# Patient Record
Sex: Female | Born: 1950 | Race: Black or African American | Hispanic: No | Marital: Single | Smoking: Never smoker
Health system: Southern US, Community
[De-identification: ages and names within clinical notes are randomized; demographics above are authoritative.]

## PROBLEM LIST (undated history)

## (undated) DIAGNOSIS — I10 Essential (primary) hypertension: Secondary | ICD-10-CM

## (undated) DIAGNOSIS — M1990 Unspecified osteoarthritis, unspecified site: Secondary | ICD-10-CM

## (undated) DIAGNOSIS — K449 Diaphragmatic hernia without obstruction or gangrene: Secondary | ICD-10-CM

## (undated) DIAGNOSIS — R0789 Other chest pain: Secondary | ICD-10-CM

## (undated) DIAGNOSIS — M109 Gout, unspecified: Secondary | ICD-10-CM

## (undated) HISTORY — PX: APPENDECTOMY: SHX54

## (undated) HISTORY — DX: Essential (primary) hypertension: I10

## (undated) HISTORY — PX: REPLACEMENT TOTAL KNEE: SUR1224

## (undated) HISTORY — DX: Other chest pain: R07.89

## (undated) HISTORY — PX: CARDIAC SURGERY: SHX584

## (undated) HISTORY — DX: Gout, unspecified: M10.9

---

## 2005-07-11 ENCOUNTER — Emergency Department: Payer: Self-pay | Admitting: Emergency Medicine

## 2005-10-19 ENCOUNTER — Other Ambulatory Visit: Payer: Self-pay

## 2005-10-19 ENCOUNTER — Emergency Department: Payer: Self-pay | Admitting: Emergency Medicine

## 2006-03-01 ENCOUNTER — Emergency Department: Payer: Self-pay | Admitting: Emergency Medicine

## 2006-04-21 ENCOUNTER — Emergency Department: Payer: Self-pay | Admitting: Emergency Medicine

## 2007-04-04 ENCOUNTER — Emergency Department: Payer: Self-pay | Admitting: Emergency Medicine

## 2007-04-23 ENCOUNTER — Ambulatory Visit: Payer: Self-pay

## 2007-06-26 ENCOUNTER — Emergency Department: Payer: Self-pay | Admitting: Emergency Medicine

## 2007-11-17 ENCOUNTER — Emergency Department: Payer: Self-pay | Admitting: Emergency Medicine

## 2007-11-17 ENCOUNTER — Other Ambulatory Visit: Payer: Self-pay

## 2008-01-09 ENCOUNTER — Emergency Department: Payer: Self-pay | Admitting: Emergency Medicine

## 2008-05-03 ENCOUNTER — Emergency Department: Payer: Self-pay | Admitting: Emergency Medicine

## 2008-07-29 ENCOUNTER — Emergency Department: Payer: Self-pay | Admitting: Emergency Medicine

## 2008-08-23 ENCOUNTER — Emergency Department: Payer: Self-pay | Admitting: Emergency Medicine

## 2008-09-28 ENCOUNTER — Emergency Department: Payer: Self-pay | Admitting: Internal Medicine

## 2008-10-15 ENCOUNTER — Emergency Department: Payer: Self-pay | Admitting: Emergency Medicine

## 2008-11-10 ENCOUNTER — Emergency Department: Payer: Self-pay | Admitting: Emergency Medicine

## 2009-04-07 ENCOUNTER — Ambulatory Visit: Payer: Self-pay | Admitting: Internal Medicine

## 2009-07-08 IMAGING — CR DG SHOULDER 3+V*R*
1 series · 3 of 3 positions shown · non-contrast
Comparison: No comparison

REASON FOR EXAM: Decrease ROM/increase pain 2nd to fall laxt night
COMMENTS:

PROCEDURE:     DXR - DXR SHOULDER RIGHT COMPLETE  - September 28, 2008  [DATE]
RESULT:     History: Decreased range of motion

[Series 1: view not recorded · 0.17mm/px · 3 of 3 slices shown]
[im 1/3]
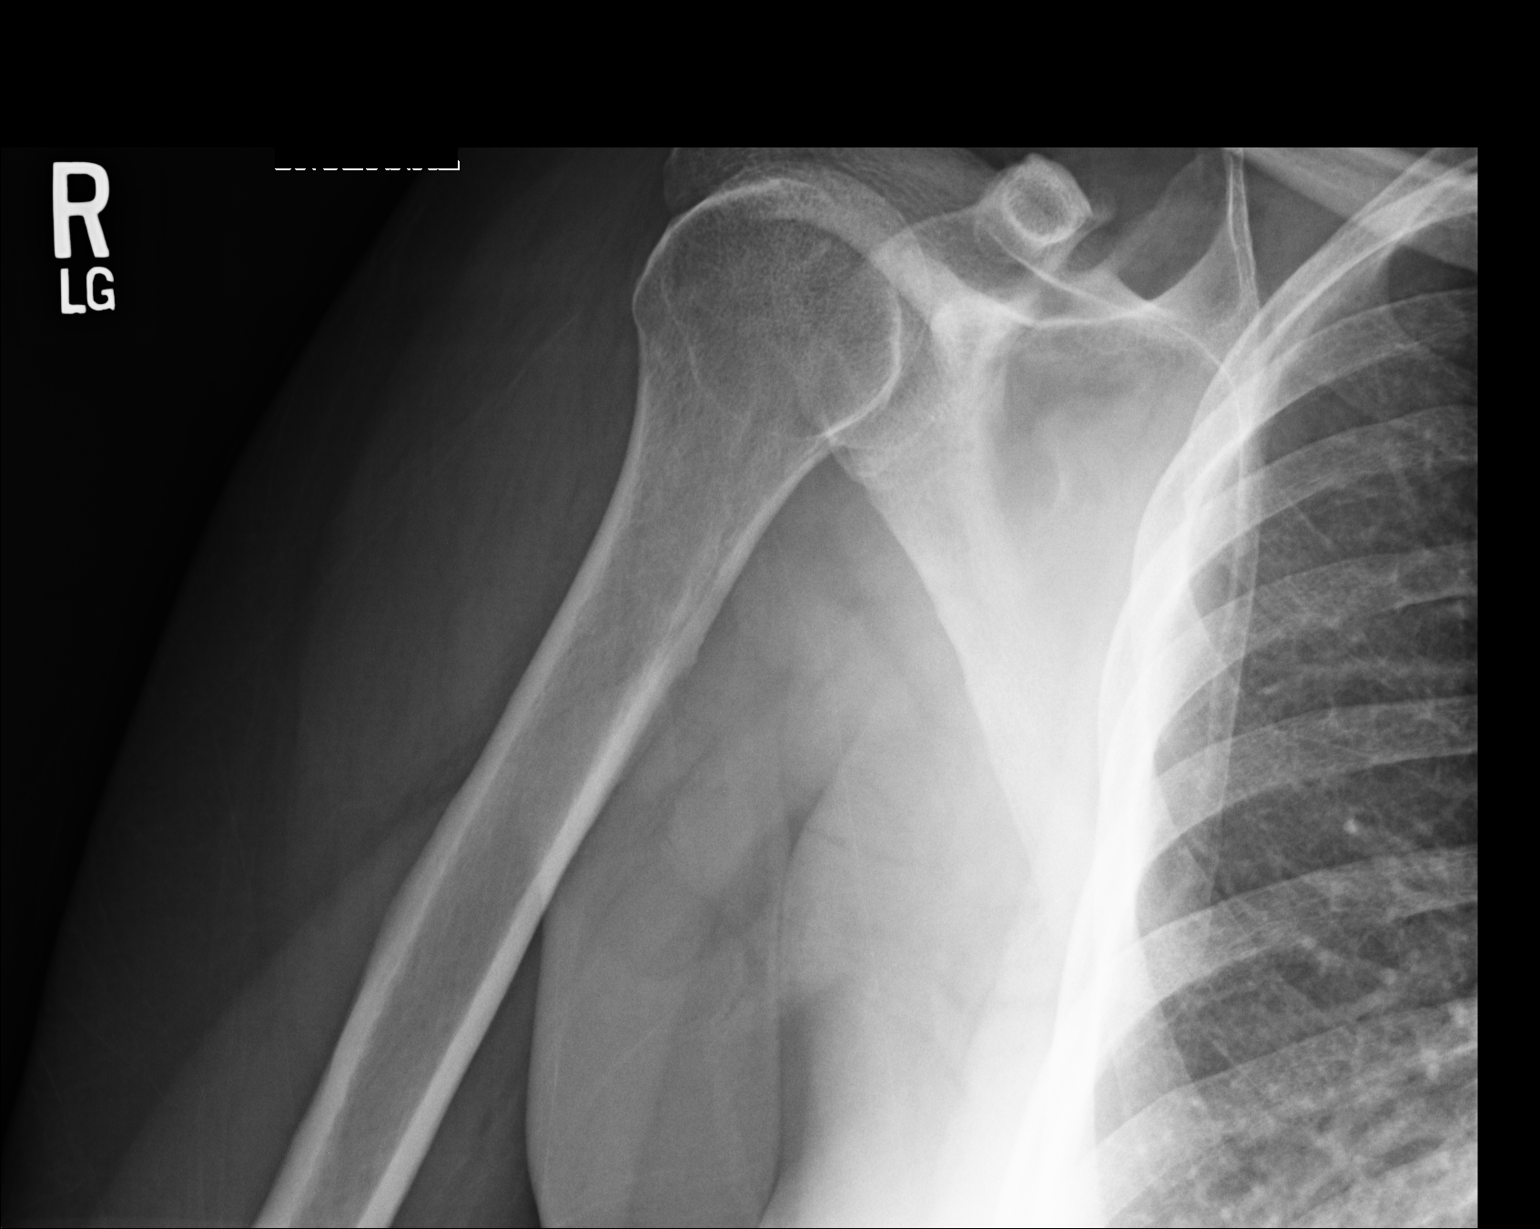
[im 2/3]
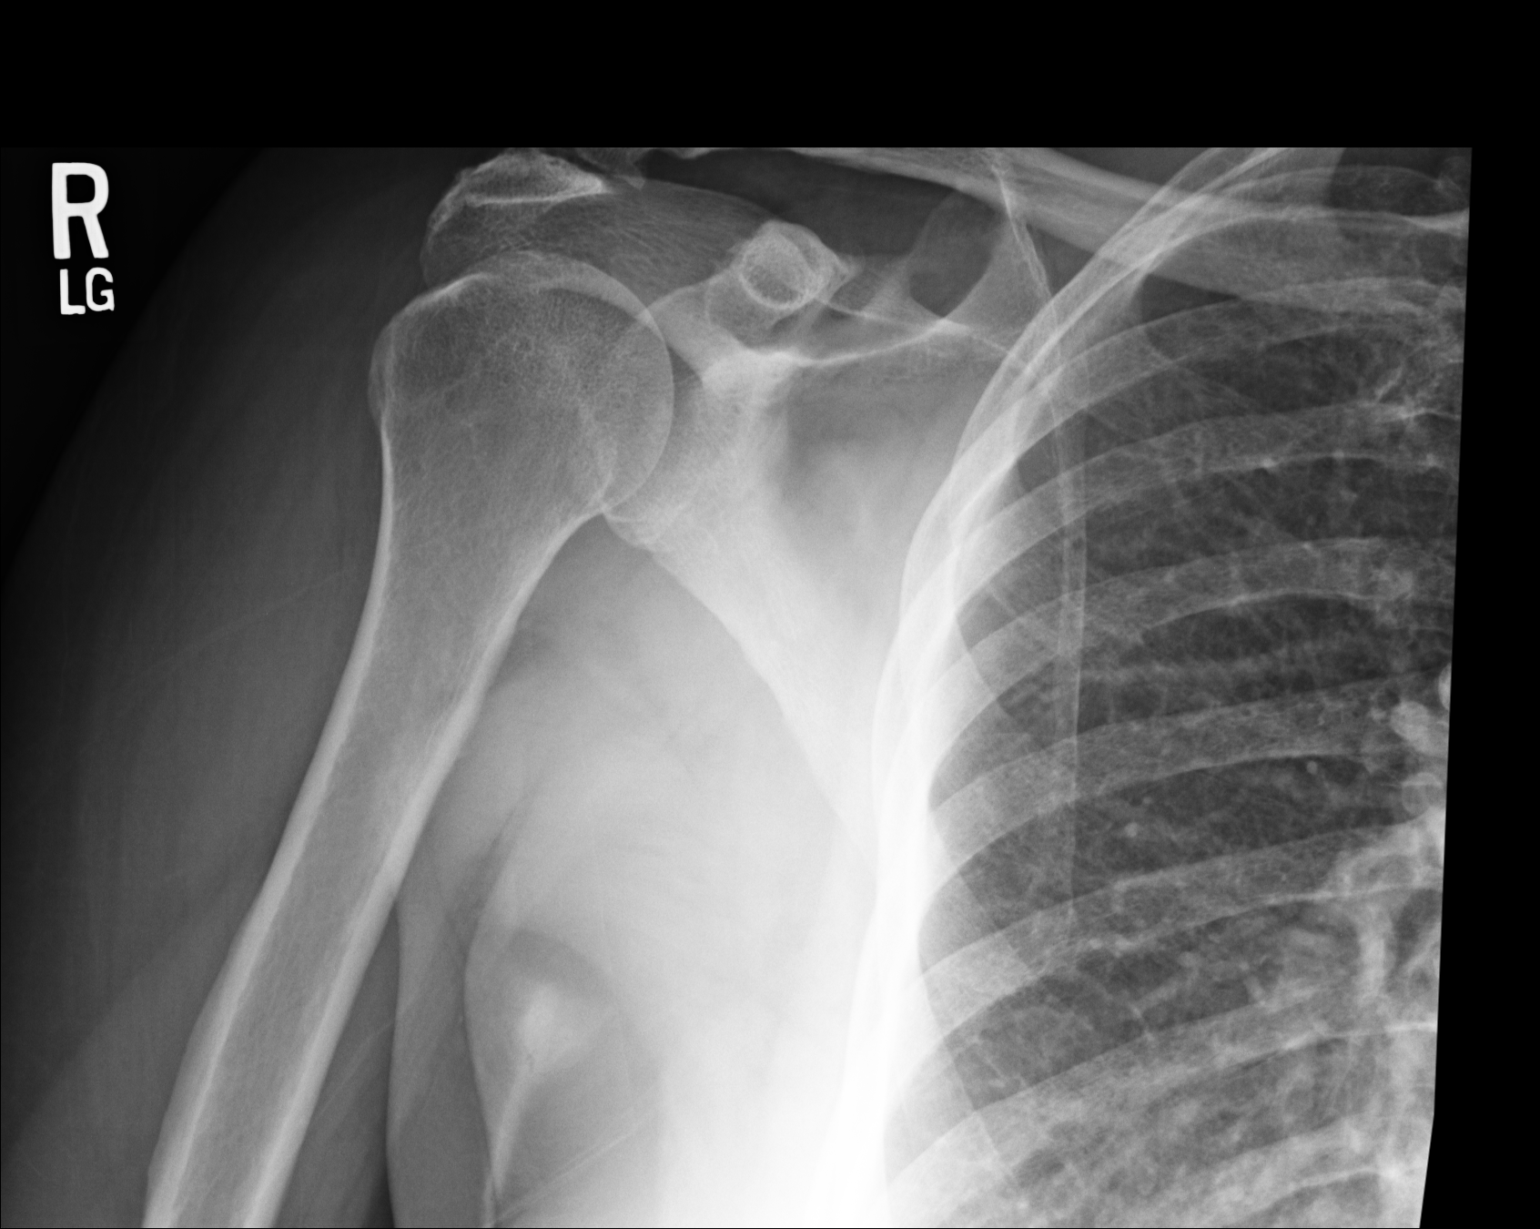
[im 3/3]
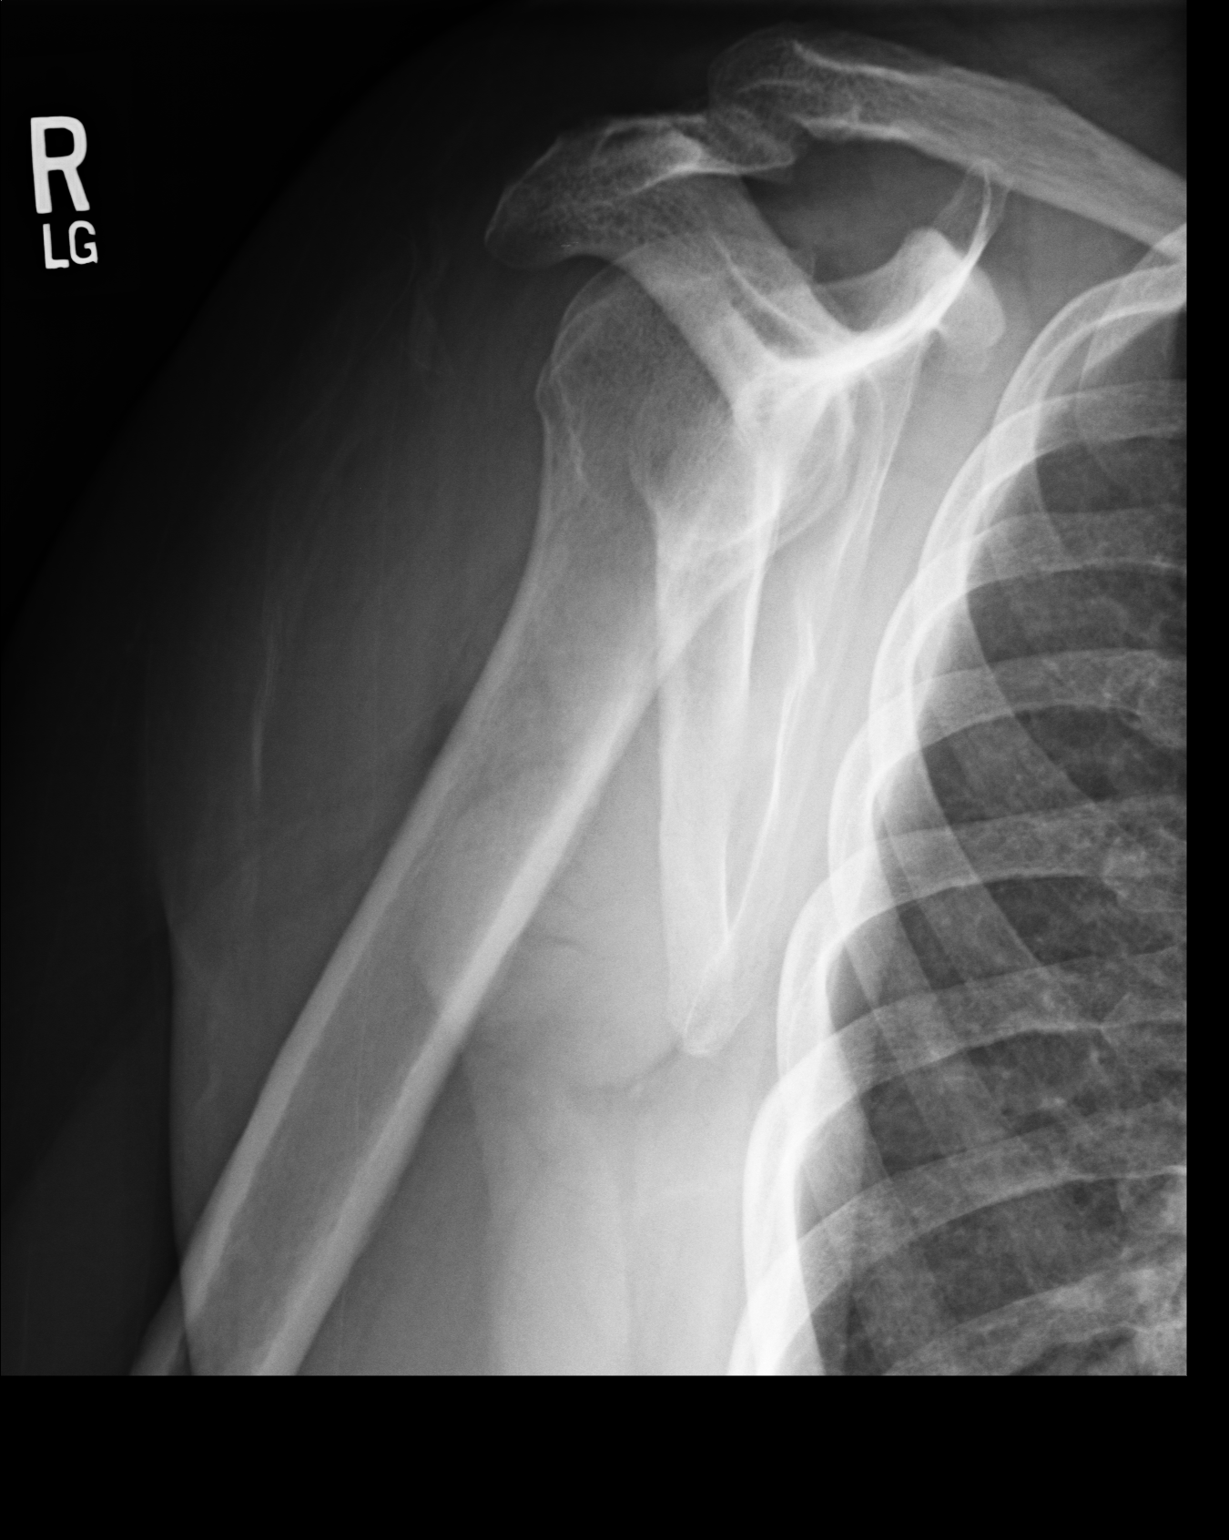

[3 of 3 positions shown; findings below may reference images not displayed]

FINDINGS: 3 views of the right shoulder demonstrates no fracture or dislocation. There
are mild degenerative changes of the right acromioclavicular joint.
IMPRESSION: No acute osseous injury of the right shoulder.

## 2009-08-16 ENCOUNTER — Emergency Department: Payer: Self-pay | Admitting: Emergency Medicine

## 2009-08-31 DIAGNOSIS — K2100 Gastro-esophageal reflux disease with esophagitis, without bleeding: Secondary | ICD-10-CM | POA: Insufficient documentation

## 2009-10-23 ENCOUNTER — Emergency Department: Payer: Self-pay | Admitting: Emergency Medicine

## 2009-11-21 ENCOUNTER — Emergency Department: Payer: Self-pay | Admitting: Internal Medicine

## 2010-01-14 ENCOUNTER — Emergency Department: Payer: Self-pay | Admitting: Emergency Medicine

## 2010-05-12 ENCOUNTER — Emergency Department: Payer: Self-pay | Admitting: Unknown Physician Specialty

## 2010-12-14 ENCOUNTER — Emergency Department: Payer: Self-pay | Admitting: Emergency Medicine

## 2011-02-07 ENCOUNTER — Emergency Department: Payer: Self-pay | Admitting: Emergency Medicine

## 2011-05-03 ENCOUNTER — Ambulatory Visit: Payer: Self-pay

## 2012-01-09 ENCOUNTER — Emergency Department: Payer: Self-pay | Admitting: Emergency Medicine

## 2012-01-09 LAB — URINALYSIS, COMPLETE
Bacteria: NONE SEEN
Bilirubin,UR: NEGATIVE
Glucose,UR: NEGATIVE mg/dL (ref 0–75)
Nitrite: NEGATIVE
Ph: 6 (ref 4.5–8.0)
RBC,UR: 4 /HPF (ref 0–5)
Specific Gravity: 1.02 (ref 1.003–1.030)
Squamous Epithelial: 4

## 2012-01-09 LAB — COMPREHENSIVE METABOLIC PANEL
Anion Gap: 7 (ref 7–16)
BUN: 21 mg/dL — ABNORMAL HIGH (ref 7–18)
Calcium, Total: 8.9 mg/dL (ref 8.5–10.1)
Chloride: 108 mmol/L — ABNORMAL HIGH (ref 98–107)
Creatinine: 0.99 mg/dL (ref 0.60–1.30)
EGFR (African American): >60
EGFR (Non-African Amer.): >60
Glucose: 124 mg/dL — ABNORMAL HIGH (ref 65–99)
Potassium: 3.7 mmol/L (ref 3.5–5.1)
SGOT(AST): 28 U/L (ref 15–37)
Total Protein: 7.7 g/dL (ref 6.4–8.2)

## 2012-01-09 LAB — CBC
MCH: 32 pg (ref 26.0–34.0)
MCV: 94 fL (ref 80–100)
Platelet: 202 10*3/uL (ref 150–440)
RBC: 3.51 10*6/uL — ABNORMAL LOW (ref 3.80–5.20)
RDW: 14.1 % (ref 11.5–14.5)

## 2012-01-09 LAB — LIPASE, BLOOD: Lipase: 291 U/L (ref 73–393)

## 2012-04-16 DIAGNOSIS — M179 Osteoarthritis of knee, unspecified: Secondary | ICD-10-CM | POA: Insufficient documentation

## 2012-12-20 ENCOUNTER — Emergency Department: Payer: Self-pay | Admitting: Emergency Medicine

## 2013-09-05 ENCOUNTER — Emergency Department: Payer: Self-pay | Admitting: Emergency Medicine

## 2013-09-05 LAB — CBC WITH DIFFERENTIAL/PLATELET
BASOS PCT: 0.4 %
Basophil #: 0 10*3/uL (ref 0.0–0.1)
EOS ABS: 0.2 10*3/uL (ref 0.0–0.7)
EOS PCT: 2.8 %
HCT: 37.5 % (ref 35.0–47.0)
HGB: 12.3 g/dL (ref 12.0–16.0)
LYMPHS ABS: 2.3 10*3/uL (ref 1.0–3.6)
LYMPHS PCT: 33.7 %
MCH: 30.4 pg (ref 26.0–34.0)
MCHC: 32.9 g/dL (ref 32.0–36.0)
MCV: 93 fL (ref 80–100)
Monocyte #: 0.7 x10 3/mm (ref 0.2–0.9)
Monocyte %: 9.8 %
NEUTROS ABS: 3.7 10*3/uL (ref 1.4–6.5)
Neutrophil %: 53.3 %
Platelet: 230 10*3/uL (ref 150–440)
RBC: 4.05 10*6/uL (ref 3.80–5.20)
RDW: 14.3 % (ref 11.5–14.5)
WBC: 6.9 10*3/uL (ref 3.6–11.0)

## 2013-09-05 LAB — COMPREHENSIVE METABOLIC PANEL
ALK PHOS: 116 U/L
AST: 25 U/L (ref 15–37)
Albumin: 3.5 g/dL (ref 3.4–5.0)
Anion Gap: 4 — ABNORMAL LOW (ref 7–16)
BUN: 14 mg/dL (ref 7–18)
Bilirubin,Total: 0.3 mg/dL (ref 0.2–1.0)
CO2: 28 mmol/L (ref 21–32)
CREATININE: 0.93 mg/dL (ref 0.60–1.30)
Calcium, Total: 8.7 mg/dL (ref 8.5–10.1)
Chloride: 109 mmol/L — ABNORMAL HIGH (ref 98–107)
EGFR (African American): >60
EGFR (Non-African Amer.): >60
Glucose: 99 mg/dL (ref 65–99)
Osmolality: 282 (ref 275–301)
Potassium: 3.6 mmol/L (ref 3.5–5.1)
SGPT (ALT): 20 U/L (ref 12–78)
Sodium: 141 mmol/L (ref 136–145)
Total Protein: 8.6 g/dL — ABNORMAL HIGH (ref 6.4–8.2)

## 2013-09-05 LAB — URINALYSIS, COMPLETE
BILIRUBIN, UR: NEGATIVE
Blood: NEGATIVE
Glucose,UR: NEGATIVE mg/dL (ref 0–75)
Ketone: NEGATIVE
Nitrite: NEGATIVE
PROTEIN: NEGATIVE
Ph: 7 (ref 4.5–8.0)
RBC,UR: 1 /HPF (ref 0–5)
SPECIFIC GRAVITY: 1.011 (ref 1.003–1.030)

## 2013-09-05 LAB — TROPONIN I: Troponin-I: 0.03 ng/mL

## 2013-09-05 LAB — LIPASE, BLOOD: LIPASE: 215 U/L (ref 73–393)

## 2013-09-07 LAB — URINE CULTURE

## 2013-10-25 DIAGNOSIS — I441 Atrioventricular block, second degree: Secondary | ICD-10-CM | POA: Insufficient documentation

## 2013-11-13 ENCOUNTER — Encounter: Payer: Self-pay | Admitting: Orthopedic Surgery

## 2013-11-27 ENCOUNTER — Observation Stay: Payer: Self-pay | Admitting: Internal Medicine

## 2013-11-27 ENCOUNTER — Encounter: Payer: Self-pay | Admitting: Orthopedic Surgery

## 2013-11-27 LAB — URINALYSIS, COMPLETE
BLOOD: NEGATIVE
Bilirubin,UR: NEGATIVE
GLUCOSE, UR: NEGATIVE mg/dL (ref 0–75)
Ketone: NEGATIVE
NITRITE: NEGATIVE
Ph: 6 (ref 4.5–8.0)
Protein: NEGATIVE
Specific Gravity: 1.004 (ref 1.003–1.030)
Squamous Epithelial: <1
WBC UR: <1 /HPF (ref 0–5)

## 2013-11-27 LAB — BASIC METABOLIC PANEL
ANION GAP: 8 (ref 7–16)
BUN: 15 mg/dL (ref 7–18)
CREATININE: 0.96 mg/dL (ref 0.60–1.30)
Calcium, Total: 8.9 mg/dL (ref 8.5–10.1)
Chloride: 106 mmol/L (ref 98–107)
Co2: 28 mmol/L (ref 21–32)
EGFR (African American): >60
EGFR (Non-African Amer.): >60
Glucose: 100 mg/dL — ABNORMAL HIGH (ref 65–99)
OSMOLALITY: 284 (ref 275–301)
Potassium: 3.6 mmol/L (ref 3.5–5.1)
Sodium: 142 mmol/L (ref 136–145)

## 2013-11-27 LAB — CK TOTAL AND CKMB (NOT AT ARMC)
CK, Total: 136 U/L
CK, Total: 164 U/L
CK, Total: 117 U/L
CK-MB: 1.2 ng/mL (ref 0.5–3.6)
CK-MB: 0.9 ng/mL (ref 0.5–3.6)
CK-MB: 1 ng/mL (ref 0.5–3.6)

## 2013-11-27 LAB — TROPONIN I
Troponin-I: 0.04 ng/mL
Troponin-I: 0.06 ng/mL — ABNORMAL HIGH
Troponin-I: 0.06 ng/mL — ABNORMAL HIGH

## 2013-11-27 LAB — CBC
HCT: 32.5 % — AB (ref 35.0–47.0)
HGB: 10.9 g/dL — ABNORMAL LOW (ref 12.0–16.0)
MCH: 30.9 pg (ref 26.0–34.0)
MCHC: 33.6 g/dL (ref 32.0–36.0)
MCV: 92 fL (ref 80–100)
Platelet: 246 10*3/uL (ref 150–440)
RBC: 3.53 10*6/uL — ABNORMAL LOW (ref 3.80–5.20)
RDW: 13.7 % (ref 11.5–14.5)
WBC: 7.2 10*3/uL (ref 3.6–11.0)

## 2013-11-27 LAB — D-DIMER(ARMC): D-Dimer: 6716 ng/ml

## 2013-11-27 LAB — LIPID PANEL
Cholesterol: 186 mg/dL (ref 0–200)
HDL Cholesterol: 44 mg/dL (ref 40–60)
Ldl Cholesterol, Calc: 117 mg/dL — ABNORMAL HIGH (ref 0–100)
Triglycerides: 126 mg/dL (ref 0–200)
VLDL CHOLESTEROL, CALC: 25 mg/dL (ref 5–40)

## 2013-11-28 DIAGNOSIS — R079 Chest pain, unspecified: Secondary | ICD-10-CM

## 2013-12-11 ENCOUNTER — Emergency Department: Payer: Self-pay | Admitting: Emergency Medicine

## 2013-12-11 LAB — CBC
HCT: 32.6 % — AB (ref 35.0–47.0)
HGB: 10.9 g/dL — ABNORMAL LOW (ref 12.0–16.0)
MCH: 30.6 pg (ref 26.0–34.0)
MCHC: 33.5 g/dL (ref 32.0–36.0)
MCV: 92 fL (ref 80–100)
Platelet: 288 10*3/uL (ref 150–440)
RBC: 3.56 10*6/uL — ABNORMAL LOW (ref 3.80–5.20)
RDW: 13.8 % (ref 11.5–14.5)
WBC: 4.9 10*3/uL (ref 3.6–11.0)

## 2013-12-11 LAB — COMPREHENSIVE METABOLIC PANEL
Albumin: 3.2 g/dL — ABNORMAL LOW (ref 3.4–5.0)
Alkaline Phosphatase: 100 U/L
Anion Gap: 6 — ABNORMAL LOW (ref 7–16)
BUN: 11 mg/dL (ref 7–18)
Bilirubin,Total: 0.5 mg/dL (ref 0.2–1.0)
CHLORIDE: 108 mmol/L — AB (ref 98–107)
CO2: 27 mmol/L (ref 21–32)
Calcium, Total: 8.8 mg/dL (ref 8.5–10.1)
Creatinine: 1.08 mg/dL (ref 0.60–1.30)
EGFR (Non-African Amer.): 55 — ABNORMAL LOW
GLUCOSE: 132 mg/dL — AB (ref 65–99)
Osmolality: 283 (ref 275–301)
Potassium: 3.4 mmol/L — ABNORMAL LOW (ref 3.5–5.1)
SGOT(AST): 17 U/L (ref 15–37)
SGPT (ALT): 17 U/L (ref 12–78)
SODIUM: 141 mmol/L (ref 136–145)
Total Protein: 8.4 g/dL — ABNORMAL HIGH (ref 6.4–8.2)

## 2013-12-11 LAB — URINALYSIS, COMPLETE
Bilirubin,UR: NEGATIVE
GLUCOSE, UR: NEGATIVE mg/dL (ref 0–75)
Ketone: NEGATIVE
Nitrite: NEGATIVE
PH: 5 (ref 4.5–8.0)
Protein: NEGATIVE
SPECIFIC GRAVITY: 1.016 (ref 1.003–1.030)
Squamous Epithelial: 2

## 2013-12-11 LAB — LIPASE, BLOOD: Lipase: 174 U/L (ref 73–393)

## 2013-12-13 ENCOUNTER — Ambulatory Visit: Payer: Self-pay | Admitting: Cardiovascular Disease

## 2013-12-13 LAB — URINE CULTURE

## 2013-12-28 ENCOUNTER — Encounter: Payer: Self-pay | Admitting: Orthopedic Surgery

## 2014-01-08 ENCOUNTER — Ambulatory Visit: Payer: PRIVATE HEALTH INSURANCE | Admitting: Cardiovascular Disease

## 2014-01-17 ENCOUNTER — Encounter: Payer: Self-pay | Admitting: *Deleted

## 2014-01-17 ENCOUNTER — Ambulatory Visit: Payer: PRIVATE HEALTH INSURANCE | Admitting: Cardiovascular Disease

## 2014-06-14 ENCOUNTER — Emergency Department: Payer: Self-pay | Admitting: Emergency Medicine

## 2014-06-14 LAB — CBC WITH DIFFERENTIAL/PLATELET
BASOS PCT: 0.5 %
Basophil #: 0 10*3/uL (ref 0.0–0.1)
EOS PCT: 1.8 %
Eosinophil #: 0.1 10*3/uL (ref 0.0–0.7)
HCT: 36.1 % (ref 35.0–47.0)
HGB: 12.1 g/dL (ref 12.0–16.0)
LYMPHS ABS: 1.8 10*3/uL (ref 1.0–3.6)
Lymphocyte %: 32.3 %
MCH: 31.5 pg (ref 26.0–34.0)
MCHC: 33.5 g/dL (ref 32.0–36.0)
MCV: 94 fL (ref 80–100)
MONO ABS: 0.5 x10 3/mm (ref 0.2–0.9)
Monocyte %: 9 %
Neutrophil #: 3.1 10*3/uL (ref 1.4–6.5)
Neutrophil %: 56.4 %
PLATELETS: 232 10*3/uL (ref 150–440)
RBC: 3.85 10*6/uL (ref 3.80–5.20)
RDW: 13.9 % (ref 11.5–14.5)
WBC: 5.6 10*3/uL (ref 3.6–11.0)

## 2014-06-14 LAB — URINALYSIS, COMPLETE
BACTERIA: NONE SEEN
Bilirubin,UR: NEGATIVE
Blood: NEGATIVE
GLUCOSE, UR: NEGATIVE mg/dL (ref 0–75)
KETONE: NEGATIVE
Nitrite: NEGATIVE
Ph: 6 (ref 4.5–8.0)
Protein: NEGATIVE
SPECIFIC GRAVITY: 1.012 (ref 1.003–1.030)
Squamous Epithelial: 1

## 2014-06-14 LAB — TROPONIN I
TROPONIN-I: 0.06 ng/mL — AB
Troponin-I: 0.05 ng/mL

## 2014-06-14 LAB — BASIC METABOLIC PANEL
Anion Gap: 8 (ref 7–16)
BUN: 18 mg/dL (ref 7–18)
CO2: 28 mmol/L (ref 21–32)
CREATININE: 1.06 mg/dL (ref 0.60–1.30)
Calcium, Total: 8.4 mg/dL — ABNORMAL LOW (ref 8.5–10.1)
Chloride: 107 mmol/L (ref 98–107)
EGFR (African American): >60
GFR CALC NON AF AMER: 56 — AB
GLUCOSE: 103 mg/dL — AB (ref 65–99)
Osmolality: 287 (ref 275–301)
POTASSIUM: 3.3 mmol/L — AB (ref 3.5–5.1)
SODIUM: 143 mmol/L (ref 136–145)

## 2014-06-16 DIAGNOSIS — E1165 Type 2 diabetes mellitus with hyperglycemia: Secondary | ICD-10-CM | POA: Insufficient documentation

## 2014-06-16 DIAGNOSIS — R0602 Shortness of breath: Secondary | ICD-10-CM

## 2014-06-16 HISTORY — DX: Shortness of breath: R06.02

## 2014-06-25 DIAGNOSIS — I480 Paroxysmal atrial fibrillation: Secondary | ICD-10-CM | POA: Insufficient documentation

## 2014-09-20 NOTE — H&P (Signed)
PATIENT NAME:  Sandra Watts, Sandra Watts MR#:  [id number] DATE OF BIRTH:  [birth year]  DATE OF ADMISSION:  11/27/2013  [facility] [place].   CHIEF COMPLAINT: Chest pain and  lower back pain.   HISTORY OF PRESENT ILLNESS: Sandra Watts is a pleasant 64 year old African-American female with past medical history of diabetes, hypertension, who comes to the Emergency Room with complaints of  lower back pain. She denies any lifting of heavy things. She denies any dysuria or fever. While in the Emergency Room, she started having some chest tightness which radiated to her left arm. She got a CT scan of the chest which showed no PE. First set of cardiac enzymes was 0.04. Troponin went up to 0.06. EKG shows normal sinus rhythm. Internal medicine was consulted for further evaluation. The patient denies any cardiac disease in the past.   PAST MEDICAL HISTORY:  1. Diabetes, not on any medication.  2. Gout.  3. Hypertension.  4. Arthritis/DJD.  5. Hiatal hernia.  6. CAD.  7. Bilateral total knee replacement.  8. History of appendectomy.   ALLERGIES: PENICILLIN.   MEDICATIONS:  1. Oxycodone 5 mg every 8 hours as needed.  2. Omeprazole 20 mg daily.  3. Lisinopril 10 mg daily.  4. Docusate 100 mg b.i.d.  5. Aspirin 81 mg daily.   REVIEW OF SYSTEMS:  CONSTITUTIONAL: No fever, fatigue, weakness.  EYES: No blurred or double vision, glaucoma or cataract.  EARS, NOSE, THROAT: No tinnitus, ear pain, hearing loss.  RESPIRATORY: No cough, wheeze, hemoptysis.  CARDIOVASCULAR: Positive for some chest pain. At present. chest pain-free. Positive for hypertension. No dyspnea or arrhythmia.  GASTROINTESTINAL: No nausea, vomiting, diarrhea, abdominal pain. No GERD.  GENITOURINARY: No dysuria or hematuria.  ENDOCRINE: No polyuria, nocturia or thyroid problems.  HEMATOLOGY: No anemia or easy bruising.  SKIN: No acne, rash or lesion.  MUSCULOSKELETAL: Positive for arthritis and DJD.  NEUROLOGIC: No CVA,  TIA, dysarthria or tremors.  PSYCHIATRIC: No anxiety, depression or bipolar disorder.  All other systems reviewed and negative.   SOCIAL HISTORY: The patient is a nonsmoker. Lives at home by herself. No use of alcohol or any other drug.   FAMILY HISTORY: Positive for hypertension.   PHYSICAL EXAMINATION:  GENERAL: The patient is awake, alert, oriented x3, not in acute distress.  VITAL SIGNS: Afebrile, pulse is 75, blood pressure is 170/83, saturations are 100% on room air.  HEENT: Atraumatic, normocephalic. Pupils equal, round and reactive to light and accommodation. EOM intact. Oral mucosa is moist.  NECK: Supple. No JVD. No carotid bruit.  RESPIRATORY: Clear to auscultation bilaterally. No rales, rhonchi, respiratory distress or labored breathing.  CARDIOVASCULAR: Both the heart sounds are normal. Rate and rhythm regular. PMI not lateralized. Chest nontender. Good pedal pulses, good femoral pulses. No lower extremity edema.  ABDOMEN: Soft, benign, nontender. No organomegaly. Positive bowel sounds.  NEUROLOGIC: Grossly intact cranial nerves II through XII. No motor or sensory deficit.  PSYCHIATRIC: The patient is awake, alert, oriented x3.   DIAGNOSTIC DATA: EKG normal sinus rhythm. CT chest: No evidence of PE. Chest x-ray: No acute cardiopulmonary abnormality. Troponin 0.04, 0.06. H and H is 10.9 and 32.5. Basic metabolic panel within normal limits. UA negative for UTI.   ASSESSMENT: The 64 year old Sandra Watts, with history of diabetes and hypertension, comes in with:   1. Chest pain. The patient has mildly elevated troponin. Will admit her to telemetry floor. Give nitroglycerin, aspirin, Lovenox. The patient is currently asymptomatic. Will put  her on Lovenox subcutaneously 1 mg/kg b.i.d. If her troponin continues to rise, consider cardiology consultation. Also consider stress test in the morning.  2. Hypertension. Continue home medication, which is lisinopril.  3. Gastroesophageal  reflux disease. Continue omeprazole.  4. History of hyperlipidemia; however, the patient is not on any medications for cholesterol. Will check lipid profile. Consider statins if lab results show abnormality.  5. Deep vein thrombosis prophylaxis. Subcutaneous heparin.   Above was discussed with the patient.   TIME SPENT: 45 minutes.   ____________________________ Wylie Hail Allena Katz, MD sap:lb D: 11/27/2013 11:47:16 ET T: 11/27/2013 11:54:59 ET JOB#: [id number]  cc: Dion Parrow A. Allena Katz, MD, <Dictator> Willow Ora MD ELECTRONICALLY SIGNED 11/30/2013 10:34

## 2014-09-20 NOTE — Discharge Summary (Signed)
PATIENT NAME:  Sandra Watts, Janyla G MR#:  [id number] DATE OF BIRTH:  04-Feb-1951  DATE OF ADMISSION:  11/27/2013 DATE OF DISCHARGE:  11/28/2013  DISCHARGE DIAGNOSES:  1. Musculoskeletal chest pain.  2. Hypertension.  3. Gout.   DISCHARGE MEDICATIONS: Aspirin 81 mg p.o. daily, oxycodone 5 mg every 8 hours as needed, omeprazole 20 mg p.o. daily, lisinopril 10 mg p.o. daily,colace 100 mg BID, >>, Imdur 30 mg p.o. daily. Note that the Imdur is new medication.   CONSULTATIONS: None.   HOSPITAL COURSE: This is a 64 year old female patient admitted for chest pain. Look at the history and physical for full details. [facility] [place]. The patient's initial set of Troponin is 0.04 and the patient was started on aspirin and Lovenox and the patient's troponins did not  go up;Marland Kitchen. There were at 0.04 and 0.06. She also had a CT chest and it did not show any PE. The patient had a stress test, it showed low risk scan. The patient's EF 39%. Dr. Mariah MillingGollan  said study  was challenging  because of significant gastrointestinal  distension  and he mentioned that the patient's scan was overall very low risk and the patient can go home and see him for persistent chest pain. If patient gets any persistent chest pain, she can see him in the office. The same is discussed with the patient. She said that she feels a lot of dyspepsia and gas and she denied any chest pain at the time of discharge and I told her that she can follow with Dr. Dossie Arbourim Gollan if she has any more chest pain. She says [facility] and she ran out of her oxycodone. She wanted refill for oxycodone and she also wanted a clearance note to continue her physical therapy. She finished her home physical therapy. She goes to outpatient physical therapy and the patient denied chest pain and wanted to continue her physical therapy. We medically cleared her to have physical therapy as she is scheduled and the patient went home  in stable condition.  PHYSICAL EXAMINATION: VITALS SIGNS: Her blood pressure was 143/88, pulse 58, temperature 97.7.  CARDIOVASCULAR: S1, S2 regular.  LUNGS: Clear to auscultation. No wheeze. No rales.   The patient went home in stable condition.   TIME SPENT: More than 30 minutes.     ____________________________ Katha HammingSnehalatha Axton Cihlar, MD sk:lt D: 11/29/2013 12:13:03 ET T: 11/29/2013 22:59:54 ET JOB#: [id number]  cc: Katha HammingSnehalatha Oniya Mandarino, MD, <Dictator> Katha HammingSNEHALATHA Avondre Richens MD ELECTRONICALLY SIGNED 12/12/2013 20:01

## 2014-10-13 DIAGNOSIS — I10 Essential (primary) hypertension: Secondary | ICD-10-CM | POA: Insufficient documentation

## 2015-01-24 ENCOUNTER — Emergency Department
Admission: EM | Admit: 2015-01-24 | Discharge: 2015-01-24 | Disposition: A | Discharge status: Home / Self Care | Payer: Medicare HMO | Attending: Emergency Medicine | Admitting: Emergency Medicine

## 2015-01-24 ENCOUNTER — Emergency Department: Payer: Medicare HMO

## 2015-01-24 ENCOUNTER — Encounter: Payer: Self-pay | Admitting: Emergency Medicine

## 2015-01-24 DIAGNOSIS — Z880 Allergy status to penicillin: Secondary | ICD-10-CM | POA: Diagnosis not present

## 2015-01-24 DIAGNOSIS — Z7982 Long term (current) use of aspirin: Secondary | ICD-10-CM | POA: Insufficient documentation

## 2015-01-24 DIAGNOSIS — I10 Essential (primary) hypertension: Secondary | ICD-10-CM | POA: Insufficient documentation

## 2015-01-24 DIAGNOSIS — R079 Chest pain, unspecified: Secondary | ICD-10-CM | POA: Diagnosis present

## 2015-01-24 DIAGNOSIS — Z79899 Other long term (current) drug therapy: Secondary | ICD-10-CM | POA: Diagnosis not present

## 2015-01-24 HISTORY — DX: Unspecified osteoarthritis, unspecified site: M19.90

## 2015-01-24 LAB — TROPONIN I

## 2015-01-24 LAB — CBC WITH DIFFERENTIAL/PLATELET
BASOS ABS: 0.1 10*3/uL (ref 0–0.1)
Basophils Relative: 1 %
EOS ABS: 0.2 10*3/uL (ref 0–0.7)
EOS PCT: 3 %
HCT: 33.9 % — ABNORMAL LOW (ref 35.0–47.0)
HEMOGLOBIN: 11.5 g/dL — AB (ref 12.0–16.0)
LYMPHS PCT: 27 %
Lymphs Abs: 1.6 10*3/uL (ref 1.0–3.6)
MCH: 31.1 pg (ref 26.0–34.0)
MCHC: 33.9 g/dL (ref 32.0–36.0)
MCV: 91.7 fL (ref 80.0–100.0)
Monocytes Absolute: 0.6 10*3/uL (ref 0.2–0.9)
Monocytes Relative: 10 %
NEUTROS PCT: 59 %
Neutro Abs: 3.5 10*3/uL (ref 1.4–6.5)
PLATELETS: 214 10*3/uL (ref 150–440)
RBC: 3.7 MIL/uL — AB (ref 3.80–5.20)
RDW: 14.2 % (ref 11.5–14.5)
WBC: 5.8 10*3/uL (ref 3.6–11.0)

## 2015-01-24 LAB — COMPREHENSIVE METABOLIC PANEL
ALT: 14 U/L (ref 14–54)
ANION GAP: 6 (ref 5–15)
AST: 23 U/L (ref 15–41)
Albumin: 3.7 g/dL (ref 3.5–5.0)
Alkaline Phosphatase: 79 U/L (ref 38–126)
BUN: 20 mg/dL (ref 6–20)
CHLORIDE: 109 mmol/L (ref 101–111)
CO2: 25 mmol/L (ref 22–32)
CREATININE: 1.14 mg/dL — AB (ref 0.44–1.00)
Calcium: 8.9 mg/dL (ref 8.9–10.3)
GFR, EST AFRICAN AMERICAN: 58 mL/min — AB (ref >60)
GFR, EST NON AFRICAN AMERICAN: 50 mL/min — AB (ref >60)
Glucose, Bld: 103 mg/dL — ABNORMAL HIGH (ref 65–99)
Potassium: 3.6 mmol/L (ref 3.5–5.1)
SODIUM: 140 mmol/L (ref 135–145)
Total Bilirubin: 0.5 mg/dL (ref 0.3–1.2)
Total Protein: 7.5 g/dL (ref 6.5–8.1)

## 2015-01-24 LAB — FIBRIN DERIVATIVES D-DIMER (ARMC ONLY): FIBRIN DERIVATIVES D-DIMER (ARMC): 3735 — AB (ref 0–499)

## 2015-01-24 LAB — BRAIN NATRIURETIC PEPTIDE: B Natriuretic Peptide: 57 pg/mL (ref 0.0–100.0)

## 2015-01-24 MED ORDER — DIPHENHYDRAMINE HCL 50 MG PO CAPS
ORAL_CAPSULE | ORAL | Status: AC
  Administered 2015-01-24: 50 mg via ORAL
  Filled 2015-01-24: qty 1

## 2015-01-24 MED ORDER — ACETAMINOPHEN 500 MG PO TABS
1000.0000 mg | ORAL_TABLET | Freq: Once | ORAL | Status: AC
  Administered 2015-01-24: 1000 mg via ORAL

## 2015-01-24 MED ORDER — SODIUM CHLORIDE 0.9 % IV SOLN
Freq: Once | INTRAVENOUS | Status: AC
  Administered 2015-01-24: 1000 mL via INTRAVENOUS

## 2015-01-24 MED ORDER — IOHEXOL 350 MG/ML SOLN
100.0000 mL | Freq: Once | INTRAVENOUS | Status: AC | PRN
  Administered 2015-01-24: 100 mL via INTRAVENOUS

## 2015-01-24 MED ORDER — ACETAMINOPHEN 500 MG PO TABS
ORAL_TABLET | ORAL | Status: AC
  Administered 2015-01-24: 1000 mg via ORAL
  Filled 2015-01-24: qty 2

## 2015-01-24 MED ORDER — DIPHENHYDRAMINE HCL 50 MG PO CAPS
50.0000 mg | ORAL_CAPSULE | Freq: Once | ORAL | Status: AC
  Administered 2015-01-24: 50 mg via ORAL

## 2015-01-24 NOTE — ED Notes (Signed)
NAD noted at time of D/C. Pt denies comments/concerns at this time.

## 2015-01-24 NOTE — Discharge Instructions (Signed)
Chest Pain (Nonspecific) °It is often hard to give a specific diagnosis for the cause of chest pain. There is always a chance that your pain could be related to something serious, such as a heart attack or a blood clot in the lungs. You need to follow up with your health care provider for further evaluation. °CAUSES  °· Heartburn. °· Pneumonia or bronchitis. °· Anxiety or stress. °· Inflammation around your heart (pericarditis) or lung (pleuritis or pleurisy). °· A blood clot in the lung. °· A collapsed lung (pneumothorax). It can develop suddenly on its own (spontaneous pneumothorax) or from trauma to the chest. °· Shingles infection (herpes zoster virus). °The chest wall is composed of bones, muscles, and cartilage. Any of these can be the source of the pain. °· The bones can be bruised by injury. °· The muscles or cartilage can be strained by coughing or overwork. °· The cartilage can be affected by inflammation and become sore (costochondritis). °DIAGNOSIS  °Lab tests or other studies may be needed to find the cause of your pain. Your health care provider may have you take a test called an ambulatory electrocardiogram (ECG). An ECG records your heartbeat patterns over a 24-hour period. You may also have other tests, such as: °· Transthoracic echocardiogram (TTE). During echocardiography, sound waves are used to evaluate how blood flows through your heart. °· Transesophageal echocardiogram (TEE). °· Cardiac monitoring. This allows your health care provider to monitor your heart rate and rhythm in real time. °· Holter monitor. This is a portable device that records your heartbeat and can help diagnose heart arrhythmias. It allows your health care provider to track your heart activity for several days, if needed. °· Stress tests by exercise or by giving medicine that makes the heart beat faster. °TREATMENT  °· Treatment depends on what may be causing your chest pain. Treatment may include: °¨ Acid blockers for  heartburn. °¨ Anti-inflammatory medicine. °¨ Pain medicine for inflammatory conditions. °¨ Antibiotics if an infection is present. °· You may be advised to change lifestyle habits. This includes stopping smoking and avoiding alcohol, caffeine, and chocolate. °· You may be advised to keep your head raised (elevated) when sleeping. This reduces the chance of acid going backward from your stomach into your esophagus. °Most of the time, nonspecific chest pain will improve within 2-3 days with rest and mild pain medicine.  °HOME CARE INSTRUCTIONS  °· If antibiotics were prescribed, take them as directed. Finish them even if you start to feel better. °· For the next few days, avoid physical activities that bring on chest pain. Continue physical activities as directed. °· Do not use any tobacco products, including cigarettes, chewing tobacco, or electronic cigarettes. °· Avoid drinking alcohol. °· Only take medicine as directed by your health care provider. °· Follow your health care provider's suggestions for further testing if your chest pain does not go away. °· Keep any follow-up appointments you made. If you do not go to an appointment, you could develop lasting (chronic) problems with pain. If there is any problem keeping an appointment, call to reschedule. °SEEK MEDICAL CARE IF:  °· Your chest pain does not go away, even after treatment. °· You have a rash with blisters on your chest. °· You have a fever. °SEEK IMMEDIATE MEDICAL CARE IF:  °· You have increased chest pain or pain that spreads to your arm, neck, jaw, back, or abdomen. °· You have shortness of breath. °· You have an increasing cough, or you cough   up blood.  You have severe back or abdominal pain.  You feel nauseous or vomit.  You have severe weakness.  You faint.  You have chills. This is an emergency. Do not wait to see if the pain will go away. Get medical help at once. Call your local emergency services (911 in [place] Do not drive  yourself to the hospital. MAKE SURE YOU:   Understand these instructions.  Will watch your condition.  Will get help right away if you are not doing well or get worse. Document Released: 02/23/2005 Document Revised: 05/21/2013 Document Reviewed: 12/20/2007 [facility] Patient Information 2015 [place], [place]. This information is not intended to replace advice given to you by your health care provider. Make sure you discuss any questions you have with your health care provider. PLEASE RETURN FOR WORSE OR DIFFERENT PAIN. FOLLOW UP WITH DR Welton Flakes THE CARDIOLOGIST. CALL ON Monday MORNING FOR AN APPOINTMENT

## 2015-01-24 NOTE — ED Notes (Signed)
Pt. Here via EMS from home for rt. Upper back pain that radiates to the rt. Side of chest.  Pt. States pain is intermittent and started Thursday night.  Pt. States feeling dizzy after taking pain medication(tylenol).

## 2015-01-24 NOTE — ED Notes (Signed)
Pt. States she took some tylenol before calling EMS.  Pt. States pain medication made her feel dizzy. Pt. States pain started on Thursday night.  Pain started in upper back and radiated to the rt. Side of chest.

## 2015-01-24 NOTE — ED Provider Notes (Signed)
[facility] Emergency Department Provider Note  ____________________________________________  Time seen: Approximately 1:47 AM  I have reviewed the triage vital signs and the nursing notes.   HISTORY  Chief Complaint Chest Pain    HPI Sandra Watts is a 64 y.o. female who reports she was cleaning up the house and developed pain in the right shoulder stabbing pain also came from the right side of the back into the right breast. Patient reports she's been having some pain in the right breast before this. Patient was washing the dishes and had some shortness of breath and sweating along with the pain. She reports she's not had this kind of pain before. The pain did not radiate anywhere else except for the shoulder and the right side of the back into the right breast. Patient reports nothing seems to make the pain better or worse except for touching the shoulder which makes the shoulder pain worse. Deep breathing and moving around does not seem to influence of pain.   Past Medical History  Diagnosis Date  . Hypertension   . Gout   . Gout   . Musculoskeletal chest pain   . Arthritis     There are no active problems to display for this patient.   Past Surgical History  Procedure Laterality Date  . Replacement total knee      Current Outpatient Rx  Name  Route  Sig  Dispense  Refill  . aspirin 81 MG tablet   Oral   Take 81 mg by mouth daily.         Marland Kitchen docusate sodium (COLACE) 100 MG capsule   Oral   Take 100 mg by mouth 2 (two) times daily.         Marland Kitchen lisinopril (PRINIVIL,ZESTRIL) 10 MG tablet   Oral   Take 10 mg by mouth daily.         . metoprolol tartrate (LOPRESSOR) 25 MG tablet   Oral   Take 25 mg by mouth 2 (two) times daily.         Marland Kitchen omeprazole (PRILOSEC) 20 MG capsule   Oral   Take 20 mg by mouth daily.           Allergies Penicillin g  History reviewed. No pertinent family history.  Social History Social History   Substance Use Topics  . Smoking status: Never Smoker   . Smokeless tobacco: None  . Alcohol Use: None    Review of Systems Constitutional: No fever/chills Eyes: No visual changes. ENT: No sore throat. Cardiovascular: See history of present illness Respiratory: See history of present illness Gastrointestinal: No abdominal pain. no vomiting.  No diarrhea.  No constipation. Genitourinary: Negative for dysuria. Musculoskeletal: Negative for back pain. Skin: Negative for rash. Neurological: Negative for headaches, focal weakness or numbness.  10-point ROS otherwise negative.  ____________________________________________   PHYSICAL EXAM:  VITAL SIGNS: ED Triage Vitals  Enc Vitals Group     BP 01/24/15 0135 135/87 mmHg     Pulse Rate 01/24/15 0135 71     Resp 01/24/15 0135 21     Temp 01/24/15 0135 98.3 F (36.8 C)     Temp Source 01/24/15 0135 Oral     SpO2 01/24/15 0135 99 %     Weight 01/24/15 0135 234 lb (106.142 kg)     Height 01/24/15 0135 5\' 8"  (1.727 m)     Head Cir --      Peak Flow --  Pain Score 01/24/15 0136 8     Pain Loc --      Pain Edu? --      Excl. in GC? --   Constitutional: Alert and oriented. Well appearing and in no acute distress. Eyes: Conjunctivae are normal. PERRL. EOMI. Head: Atraumatic. Nose: No congestion/rhinnorhea. Mouth/Throat: Mucous membranes are moist.  Oropharynx non-erythematous. Neck: No stridor.Cardiovascular: Normal rate, regular rhythm. Grossly normal heart sounds.  Good peripheral circulation. Respiratory: Normal respiratory effort.  No retractions. Lungs CTAB. Gastrointestinal: Soft and nontender. No distention. No abdominal bruits. No CVA tenderness. Musculoskeletal: No lower extremity tenderness nor edema.  No joint effusions. Neurologic:  Normal speech and language. No gross focal neurologic deficits are appreciated. No gait instability. Skin:  Skin is warm, dry and intact. No rash noted. Psychiatric: Mood and affect  are normal. Speech and behavior are normal.  ____________________________________________   LABS (all labs ordered are listed, but only abnormal results are displayed)  Labs Reviewed  COMPREHENSIVE METABOLIC PANEL - Abnormal; Notable for the following:    Glucose, Bld 103 (*)    Creatinine, Ser 1.14 (*)    GFR calc non Af Amer 50 (*)    GFR calc Af Amer 58 (*)    All other components within normal limits  CBC WITH DIFFERENTIAL/PLATELET - Abnormal; Notable for the following:    RBC 3.70 (*)    Hemoglobin 11.5 (*)    HCT 33.9 (*)    All other components within normal limits  FIBRIN DERIVATIVES D-DIMER ([facility] ONLY) - Abnormal; Notable for the following:    Fibrin derivatives D-dimer [facility]) 3735 (*)    All other components within normal limits  TROPONIN I  BRAIN NATRIURETIC PEPTIDE  TROPONIN I   ____________________________________________  EKG  EKG read and interpreted by me shows normal sinus rhythm at a rate of 67 normal axis and normal EKG ____________________________________________  RADIOLOGY  Chest x-ray and shoulder x-ray are read as no acute disease I have reviewed the films and concur. Chest CT shows no PE ____________________________________________   PROCEDURES    ____________________________________________   INITIAL IMPRESSION / ASSESSMENT AND PLAN / ED COURSE  Pertinent labs & imaging results that were available during my care of the patient were reviewed by me and considered in my medical decision making (see chart for details). Chest patient's chest pain is atypical 2 troponins are negative EKG looks okay as no sign of a PE the patient follow-up with cardiologist for further workup patient appears stable and will return if worse  ____________________________________________   FINAL CLINICAL IMPRESSION(S) / ED DIAGNOSES  Final diagnoses:  Chest pain, unspecified chest pain type      Arnaldo Natal, MD 01/24/15 [phone]

## 2015-07-02 DIAGNOSIS — Z7982 Long term (current) use of aspirin: Secondary | ICD-10-CM | POA: Insufficient documentation

## 2015-07-02 DIAGNOSIS — R0789 Other chest pain: Secondary | ICD-10-CM | POA: Diagnosis not present

## 2015-07-02 DIAGNOSIS — I10 Essential (primary) hypertension: Secondary | ICD-10-CM | POA: Diagnosis not present

## 2015-07-02 DIAGNOSIS — R079 Chest pain, unspecified: Secondary | ICD-10-CM | POA: Diagnosis present

## 2015-07-02 DIAGNOSIS — Z880 Allergy status to penicillin: Secondary | ICD-10-CM | POA: Insufficient documentation

## 2015-07-02 DIAGNOSIS — Z79899 Other long term (current) drug therapy: Secondary | ICD-10-CM | POA: Diagnosis not present

## 2015-07-03 ENCOUNTER — Emergency Department
Admission: EM | Admit: 2015-07-03 | Discharge: 2015-07-03 | Disposition: A | Discharge status: Home / Self Care | Payer: Medicare HMO | Attending: Emergency Medicine | Admitting: Emergency Medicine

## 2015-07-03 ENCOUNTER — Emergency Department: Payer: Medicare HMO

## 2015-07-03 ENCOUNTER — Other Ambulatory Visit: Payer: Self-pay

## 2015-07-03 DIAGNOSIS — R0789 Other chest pain: Secondary | ICD-10-CM

## 2015-07-03 LAB — URINALYSIS COMPLETE WITH MICROSCOPIC (ARMC ONLY)
BILIRUBIN URINE: NEGATIVE
Glucose, UA: NEGATIVE mg/dL
Hgb urine dipstick: NEGATIVE
Ketones, ur: NEGATIVE mg/dL
Leukocytes, UA: NEGATIVE
Nitrite: NEGATIVE
PH: 5 (ref 5.0–8.0)
Protein, ur: NEGATIVE mg/dL
SQUAMOUS EPITHELIAL / LPF: NONE SEEN
Specific Gravity, Urine: 1.024 (ref 1.005–1.030)

## 2015-07-03 LAB — TROPONIN I

## 2015-07-03 LAB — BASIC METABOLIC PANEL
ANION GAP: 7 (ref 5–15)
BUN: 19 mg/dL (ref 6–20)
CALCIUM: 9.1 mg/dL (ref 8.9–10.3)
CO2: 25 mmol/L (ref 22–32)
Chloride: 108 mmol/L (ref 101–111)
Creatinine, Ser: 1.01 mg/dL — ABNORMAL HIGH (ref 0.44–1.00)
GFR, EST NON AFRICAN AMERICAN: 58 mL/min — AB (ref >60)
GLUCOSE: 111 mg/dL — AB (ref 65–99)
POTASSIUM: 3.7 mmol/L (ref 3.5–5.1)
SODIUM: 140 mmol/L (ref 135–145)

## 2015-07-03 LAB — CBC
HCT: 34 % — ABNORMAL LOW (ref 35.0–47.0)
HEMOGLOBIN: 11.4 g/dL — AB (ref 12.0–16.0)
MCH: 30.6 pg (ref 26.0–34.0)
MCHC: 33.4 g/dL (ref 32.0–36.0)
MCV: 91.5 fL (ref 80.0–100.0)
PLATELETS: 208 10*3/uL (ref 150–440)
RBC: 3.71 MIL/uL — AB (ref 3.80–5.20)
RDW: 13.8 % (ref 11.5–14.5)
WBC: 5.5 10*3/uL (ref 3.6–11.0)

## 2015-07-03 NOTE — ED Provider Notes (Signed)
[facility] Emergency Department Provider Note  ____________________________________________  Time seen: Approximately 3:40 AM  I have reviewed the triage vital signs and the nursing notes.   HISTORY  Chief Complaint Breast Pain    HPI Sandra Watts is a 65 y.o. female with a past medical history that includes musculoskeletal chest pain, gout, arthritis, hypertension presents with about 2 days of left lateral chest wall pain.  She states that if she touches the left lateral portion of her breast she can reproduce the pain.  She describes it as mild to moderate.  It is better she is not touching it but somewhat worse with movement.  She denies central chest pain, shortness of breath, abdominal pain, nausea, vomiting, diarrhea, dysuria.  She thinks that she may have pulled a muscle because she describes the pain as aching, sharp when she presses it, reproducible with touch and movement.  She denies any trauma.  It was relatively acute in onset and persistent for about 2 days.  Though she denied any dysuria to me, she reports that several days ago she had a couple of days in a row where her urine seemed foul-smelling, but that has completely resolved.   Past Medical History  Diagnosis Date  . Hypertension   . Gout   . Gout   . Musculoskeletal chest pain   . Arthritis     There are no active problems to display for this patient.   Past Surgical History  Procedure Laterality Date  . Replacement total knee      Current Outpatient Rx  Name  Route  Sig  Dispense  Refill  . aspirin 81 MG tablet   Oral   Take 81 mg by mouth daily.         Marland Kitchen atorvastatin (LIPITOR) 40 MG tablet   Oral   Take 40 mg by mouth daily.         Marland Kitchen docusate sodium (COLACE) 100 MG capsule   Oral   Take 100 mg by mouth 2 (two) times daily.         Marland Kitchen lisinopril (PRINIVIL,ZESTRIL) 10 MG tablet   Oral   Take 10 mg by mouth daily.         . metoprolol tartrate (LOPRESSOR)  25 MG tablet   Oral   Take 25 mg by mouth 2 (two) times daily.         Marland Kitchen omeprazole (PRILOSEC) 20 MG capsule   Oral   Take 20 mg by mouth daily.           Allergies Penicillin g  No family history on file.  Social History Social History  Substance Use Topics  . Smoking status: Never Smoker   . Smokeless tobacco: Not on file  . Alcohol Use: Not on file    Review of Systems Constitutional: No fever/chills Eyes: No visual changes. ENT: No sore throat. Cardiovascular: Left lateral breast/chest wall pain Respiratory: Denies shortness of breath. Gastrointestinal: No abdominal pain.  No nausea, no vomiting.  No diarrhea.  No constipation. Genitourinary: Negative for dysuria.  Foul-smelling urine several days ago. Musculoskeletal: Negative for back pain. Skin: Negative for rash. Neurological: Negative for headaches, focal weakness or numbness.  10-point ROS otherwise negative.  ____________________________________________   PHYSICAL EXAM:  VITAL SIGNS: ED Triage Vitals  Enc Vitals Group     BP 07/03/15 0013 104/70 mmHg     Pulse Rate 07/03/15 0013 70     Resp 07/03/15 0013 18  Temp 07/03/15 0013 97.4 F (36.3 C)     Temp Source 07/03/15 0013 Oral     SpO2 07/03/15 0013 100 %     Weight 07/03/15 0015 235 lb (106.595 kg)     Height 07/03/15 0015 5\' 7"  (1.702 m)     Head Cir --      Peak Flow --      Pain Score 07/03/15 0014 7     Pain Loc --      Pain Edu? --      Excl. in GC? --     Constitutional: Alert and oriented. Well appearing and in no acute distress. Eyes: Conjunctivae are normal. PERRL. EOMI. Head: Atraumatic. Nose: No congestion/rhinnorhea. Mouth/Throat: Mucous membranes are moist.  Oropharynx non-erythematous. Neck: No stridor.   Cardiovascular: Normal rate, regular rhythm. Grossly normal heart sounds.  Good peripheral circulation.  Reproducible left lateral chest wall/breast pain.  No lumps or nodules, no abscess, no indication of  cellulitis or other infection. Respiratory: Normal respiratory effort.  No retractions. Lungs CTAB. Gastrointestinal: Soft and nontender. No distention. No abdominal bruits. No CVA tenderness. Musculoskeletal: No lower extremity tenderness nor edema.  No joint effusions. Neurologic:  Normal speech and language. No gross focal neurologic deficits are appreciated.  Skin:  Skin is warm, dry and intact. No rash noted. Psychiatric: Mood and affect are normal. Speech and behavior are normal.  ____________________________________________   LABS (all labs ordered are listed, but only abnormal results are displayed)  Labs Reviewed  URINALYSIS COMPLETEWITH MICROSCOPIC ([facility] ONLY) - Abnormal; Notable for the following:    Color, Urine YELLOW (*)    APPearance CLEAR (*)    Bacteria, UA RARE (*)    All other components within normal limits  CBC - Abnormal; Notable for the following:    RBC 3.71 (*)    Hemoglobin 11.4 (*)    HCT 34.0 (*)    All other components within normal limits  BASIC METABOLIC PANEL - Abnormal; Notable for the following:    Glucose, Bld 111 (*)    Creatinine, Ser 1.01 (*)    GFR calc non Af Amer 58 (*)    All other components within normal limits  TROPONIN I   ____________________________________________  EKG  ED ECG REPORT I, Shelli Portilla, the attending physician, personally viewed and interpreted this ECG.  Date: 07/03/2015 EKG Time: 00:10 Rate: 63 Rhythm: normal sinus rhythm QRS Axis: normal Intervals: normal ST/T Wave abnormalities: normal Conduction Disturbances: none Narrative Interpretation: unremarkable  ____________________________________________  RADIOLOGY   Dg Chest 2 View  07/03/2015  CLINICAL DATA:  Left breast pain.  Cough for 2 days. EXAM: CHEST  2 VIEW COMPARISON:  Radiographs and CT 01/24/2015 FINDINGS: The cardiomediastinal contours are normal. There is a hiatal hernia. The lungs are clear. Pulmonary vasculature is normal. No  consolidation, pleural effusion, or pneumothorax. No acute osseous abnormalities are seen. IMPRESSION: No acute pulmonary process. Electronically Signed   By: Rubye Oaks M.D.   On: 07/03/2015 01:06    ____________________________________________   PROCEDURES  Procedure(s) performed: None  Critical Care performed: No ____________________________________________   INITIAL IMPRESSION / ASSESSMENT AND PLAN / ED COURSE  Pertinent labs & imaging results that were available during my care of the patient were reviewed by me and considered in my medical decision making (see chart for details).  HEART score 2, Well's score for PE zero.  Highly reproducible chest wall pain, likely MSK.  No indication for further troponin.  Patient states that the pain  has "eased off" since coming to the ED.  Will d/c for outpatient follow up.    I gave my usual and customary return precautions.   No indication for antibiotics based on unremarkable UA.  ____________________________________________  FINAL CLINICAL IMPRESSION(S) / ED DIAGNOSES  Final diagnoses:  Left-sided chest wall pain      NEW MEDICATIONS STARTED DURING THIS VISIT:  New Prescriptions   No medications on file     Loleta Rose, MD 07/03/15 [phone]

## 2015-07-03 NOTE — ED Notes (Signed)
Dr. Forbach at bedside.  

## 2015-07-03 NOTE — ED Notes (Signed)
Pt states that she has been having left breast pain intermittently all day. Pt states that she can touch the outer left breast and it hurts more. Pt also states that she has been having pain with urination for the past week

## 2015-07-03 NOTE — Discharge Instructions (Signed)
You have been seen in the Emergency Department (ED) today for chest pain.  As we have discussed todays test results are normal, and we believe your pain is due to pain/strain and/or inflammation of the muscles and/or cartilage of your chest wall.  We recommend you take ibuprofen 600 mg three times a day with meals for the next 5 days (unless you have been told previously not to take ibuprofen or NSAIDs in general).  You may also take Tylenol according to the label instructions.  Read through the included information for additional treatment recommendations and precautions.  Your urinalysis was reassuring, and there is no sign that you have a urinary tract infection (UTI).  Continue to take your regular medications.   Return to the Emergency Department (ED) if you experience any further chest pain/pressure/tightness, difficulty breathing, or sudden sweating, or other symptoms that concern you.   Chest Wall Pain Chest wall pain is pain in or around the bones and muscles of your chest. Sometimes, an injury causes this pain. Sometimes, the cause may not be known. This pain may take several weeks or longer to get better. HOME CARE Pay attention to any changes in your symptoms. Take these actions to help with your pain:  Rest as told by your doctor.  Avoid activities that cause pain. Try not to use your chest, belly (abdominal), or side muscles to lift heavy things.  If directed, apply ice to the painful area:  Put ice in a plastic bag.  Place a towel between your skin and the bag.  Leave the ice on for 20 minutes, 2-3 times per day.  Take over-the-counter and prescription medicines only as told by your doctor.  Do not use tobacco products, including cigarettes, chewing tobacco, and e-cigarettes. If you need help quitting, ask your doctor.  Keep all follow-up visits as told by your doctor. This is important. GET HELP IF:  You have a fever.  Your chest pain gets worse.  You have new  symptoms. GET HELP RIGHT AWAY IF:  You feel sick to your stomach (nauseous) or you throw up (vomit).  You feel sweaty or light-headed.  You have a cough with phlegm (sputum) or you cough up blood.  You are short of breath.   This information is not intended to replace advice given to you by your health care provider. Make sure you discuss any questions you have with your health care provider.   Document Released: 11/02/2007 Document Revised: 02/04/2015 Document Reviewed: 08/11/2014 Elsevier Interactive Patient Education [employer]

## 2015-07-13 ENCOUNTER — Other Ambulatory Visit: Payer: Self-pay | Admitting: Internal Medicine

## 2015-07-13 DIAGNOSIS — N644 Mastodynia: Secondary | ICD-10-CM

## 2015-07-23 ENCOUNTER — Ambulatory Visit
Admission: RE | Admit: 2015-07-23 | Discharge: 2015-07-23 | Disposition: A | Discharge status: Home / Self Care | Payer: Medicare HMO | Source: Ambulatory Visit | Attending: Internal Medicine | Admitting: Internal Medicine

## 2015-07-23 ENCOUNTER — Other Ambulatory Visit: Payer: Self-pay | Admitting: Internal Medicine

## 2015-07-23 DIAGNOSIS — Z1231 Encounter for screening mammogram for malignant neoplasm of breast: Secondary | ICD-10-CM | POA: Diagnosis present

## 2015-07-23 DIAGNOSIS — N644 Mastodynia: Secondary | ICD-10-CM | POA: Diagnosis present

## 2016-05-06 ENCOUNTER — Other Ambulatory Visit: Payer: Self-pay | Admitting: Internal Medicine

## 2016-05-06 DIAGNOSIS — R109 Unspecified abdominal pain: Secondary | ICD-10-CM

## 2016-05-06 DIAGNOSIS — R1011 Right upper quadrant pain: Secondary | ICD-10-CM

## 2016-05-12 ENCOUNTER — Ambulatory Visit: Payer: Medicare HMO

## 2016-06-02 ENCOUNTER — Other Ambulatory Visit: Payer: Self-pay | Admitting: Internal Medicine

## 2016-06-02 DIAGNOSIS — Z1231 Encounter for screening mammogram for malignant neoplasm of breast: Secondary | ICD-10-CM

## 2016-06-02 DIAGNOSIS — N644 Mastodynia: Secondary | ICD-10-CM

## 2016-06-03 ENCOUNTER — Ambulatory Visit
Admission: RE | Admit: 2016-06-03 | Discharge: 2016-06-03 | Disposition: A | Discharge status: Home / Self Care | Payer: Medicare HMO | Source: Ambulatory Visit | Attending: Internal Medicine | Admitting: Internal Medicine

## 2016-06-03 DIAGNOSIS — R1011 Right upper quadrant pain: Secondary | ICD-10-CM | POA: Diagnosis not present

## 2016-06-16 ENCOUNTER — Other Ambulatory Visit: Payer: PRIVATE HEALTH INSURANCE

## 2016-06-28 ENCOUNTER — Ambulatory Visit
Admission: RE | Admit: 2016-06-28 | Discharge: 2016-06-28 | Disposition: A | Discharge status: Home / Self Care | Payer: Medicare HMO | Source: Ambulatory Visit | Attending: Internal Medicine | Admitting: Internal Medicine

## 2016-06-28 DIAGNOSIS — Z1231 Encounter for screening mammogram for malignant neoplasm of breast: Secondary | ICD-10-CM

## 2016-06-28 DIAGNOSIS — N644 Mastodynia: Secondary | ICD-10-CM

## 2017-06-06 ENCOUNTER — Other Ambulatory Visit: Payer: Self-pay | Admitting: Internal Medicine

## 2017-06-06 DIAGNOSIS — Z1231 Encounter for screening mammogram for malignant neoplasm of breast: Secondary | ICD-10-CM

## 2017-12-27 ENCOUNTER — Other Ambulatory Visit: Payer: Self-pay | Admitting: Nurse Practitioner

## 2017-12-27 DIAGNOSIS — Z1231 Encounter for screening mammogram for malignant neoplasm of breast: Secondary | ICD-10-CM

## 2018-01-15 ENCOUNTER — Ambulatory Visit
Admission: RE | Admit: 2018-01-15 | Discharge: 2018-01-15 | Disposition: A | Discharge status: Home / Self Care | Payer: Medicare HMO | Source: Ambulatory Visit | Attending: Nurse Practitioner | Admitting: Nurse Practitioner

## 2018-01-15 DIAGNOSIS — Z1231 Encounter for screening mammogram for malignant neoplasm of breast: Secondary | ICD-10-CM | POA: Diagnosis not present

## 2018-02-21 ENCOUNTER — Ambulatory Visit: Admission: RE | Admit: 2018-02-21 | Payer: Medicare HMO | Source: Ambulatory Visit | Admitting: Internal Medicine

## 2018-02-21 ENCOUNTER — Encounter: Admission: RE | Payer: Self-pay | Source: Ambulatory Visit

## 2018-02-21 SURGERY — COLONOSCOPY WITH PROPOFOL
Anesthesia: General

## 2018-05-06 ENCOUNTER — Other Ambulatory Visit: Payer: Self-pay

## 2018-05-06 ENCOUNTER — Emergency Department
Admission: EM | Admit: 2018-05-06 | Discharge: 2018-05-06 | Disposition: A | Discharge status: Home / Self Care | Payer: Medicare HMO | Attending: Emergency Medicine | Admitting: Emergency Medicine

## 2018-05-06 DIAGNOSIS — I10 Essential (primary) hypertension: Secondary | ICD-10-CM | POA: Insufficient documentation

## 2018-05-06 DIAGNOSIS — N6320 Unspecified lump in the left breast, unspecified quadrant: Secondary | ICD-10-CM | POA: Diagnosis not present

## 2018-05-06 DIAGNOSIS — N63 Unspecified lump in unspecified breast: Secondary | ICD-10-CM

## 2018-05-06 DIAGNOSIS — Z79899 Other long term (current) drug therapy: Secondary | ICD-10-CM | POA: Insufficient documentation

## 2018-05-06 DIAGNOSIS — Z96659 Presence of unspecified artificial knee joint: Secondary | ICD-10-CM | POA: Insufficient documentation

## 2018-05-06 DIAGNOSIS — T148XXA Other injury of unspecified body region, initial encounter: Secondary | ICD-10-CM | POA: Diagnosis not present

## 2018-05-06 DIAGNOSIS — X509XXA Other and unspecified overexertion or strenuous movements or postures, initial encounter: Secondary | ICD-10-CM | POA: Insufficient documentation

## 2018-05-06 DIAGNOSIS — Y998 Other external cause status: Secondary | ICD-10-CM | POA: Insufficient documentation

## 2018-05-06 DIAGNOSIS — N644 Mastodynia: Secondary | ICD-10-CM | POA: Diagnosis present

## 2018-05-06 DIAGNOSIS — Y9389 Activity, other specified: Secondary | ICD-10-CM | POA: Diagnosis not present

## 2018-05-06 DIAGNOSIS — Y929 Unspecified place or not applicable: Secondary | ICD-10-CM | POA: Diagnosis not present

## 2018-05-06 DIAGNOSIS — Z7982 Long term (current) use of aspirin: Secondary | ICD-10-CM | POA: Insufficient documentation

## 2018-05-06 MED ORDER — BACLOFEN 10 MG PO TABS: 10.0000 mg | ORAL_TABLET | Freq: Two times a day (BID) | ORAL | 0 refills | Status: AC

## 2018-05-06 NOTE — ED Triage Notes (Signed)
Pt states L breast area pain x few weeks. States she thinks it's a muscle spasms. States was pulling a heavy person up who weighs over 300lbs a few days ago. A&O, ambulatory. No distress noted.

## 2018-05-06 NOTE — ED Notes (Signed)
See triage note. Pt states sometimes the pain in the L breast runs down into the L arm; states it is a dull pain; denies numbness. Radial pulse 2+ in L wrist.

## 2018-05-06 NOTE — ED Provider Notes (Signed)
[facility] Emergency Department Provider Note  ____________________________________________   First MD Initiated Contact with Patient 05/06/18 1734     (approximate)  I have reviewed the triage vital signs and the nursing notes.   HISTORY  Chief Complaint Breast Pain    HPI Sandra Watts is a 67 y.o. female presents emergency department complaint of left-sided breast pain.  She states that she thinks it is a muscle spasm.  Sensations been going on for a few weeks.  She was pulling on a heavy person that weighed over 300 pounds.  She denies any cardiac type chest pain, or shortness of breath.  She states she has a history of cardiac arrhythmia.    Past Medical History:  Diagnosis Date  . Arthritis   . Gout   . Gout   . Hypertension   . Musculoskeletal chest pain     There are no active problems to display for this patient.   Past Surgical History:  Procedure Laterality Date  . REPLACEMENT TOTAL KNEE      Prior to Admission medications   Medication Sig Start Date End Date Taking? Authorizing Provider  aspirin 81 MG tablet Take 81 mg by mouth daily.    [provider]  atorvastatin (LIPITOR) 40 MG tablet Take 40 mg by mouth daily. 04/15/15   [provider]  baclofen (LIORESAL) 10 MG tablet Take 1 tablet (10 mg total) by mouth 2 (two) times daily. 05/06/18 05/06/19  Malyn Aytes, Roselyn Bering, PA-C  docusate sodium (COLACE) 100 MG capsule Take 100 mg by mouth 2 (two) times daily.    [provider]  lisinopril (PRINIVIL,ZESTRIL) 10 MG tablet Take 10 mg by mouth daily.    [provider]  metoprolol tartrate (LOPRESSOR) 25 MG tablet Take 25 mg by mouth 2 (two) times daily.    [provider]  omeprazole (PRILOSEC) 20 MG capsule Take 20 mg by mouth daily.    [provider]    Allergies Penicillin g  Family History  Problem Relation Age of Onset  . Breast cancer Cousin        paternal    Social  History Social History   Tobacco Use  . Smoking status: Never Smoker  Substance Use Topics  . Alcohol use: Not Currently  . Drug use: Not on file    Review of Systems  Constitutional: No fever/chills Eyes: No visual changes. ENT: No sore throat. Respiratory: Denies cough Chest: Positive for left-sided breast pain Genitourinary: Negative for dysuria. Musculoskeletal: Negative for back pain. Skin: Negative for rash.    ____________________________________________   PHYSICAL EXAM:  VITAL SIGNS: ED Triage Vitals  Enc Vitals Group     BP 05/06/18 1635 (!) 157/84     Pulse Rate 05/06/18 1635 80     Resp 05/06/18 1635 18     Temp 05/06/18 1635 97.9 F (36.6 C)     Temp Source 05/06/18 1635 Oral     SpO2 05/06/18 1635 100 %     Weight 05/06/18 1636 260 lb (117.9 kg)     Height 05/06/18 1636 5\' 7"  (1.702 m)     Head Circumference --      Peak Flow --      Pain Score 05/06/18 1635 7     Pain Loc --      Pain Edu? --      Excl. in GC? --     Constitutional: Alert and oriented. Well appearing and in no acute  distress. Eyes: Conjunctivae are normal.  Head: Atraumatic. Nose: No congestion/rhinnorhea. Mouth/Throat: Mucous membranes are moist.   Neck:  supple no lymphadenopathy noted Cardiovascular: Normal rate, regular rhythm. Heart sounds are normal Respiratory: Normal respiratory effort.  No retractions, lungs c t a  Breast: The left breast has a small tiny nodule noted at 3:00 no lymphadenopathy is noted  GU: deferred Musculoskeletal: FROM all extremities, warm and well perfused Neurologic:  Normal speech and language.  Skin:  Skin is warm, dry and intact. No rash noted. Psychiatric: Mood and affect are normal. Speech and behavior are normal.  ____________________________________________   LABS (all labs ordered are listed, but only abnormal results are displayed)  Labs Reviewed - No data to  display ____________________________________________   ____________________________________________  RADIOLOGY    ____________________________________________   PROCEDURES  Procedure(s) performed: EKG shows normal sinus rhythm with a few PVCs, EKG is similar to the ones from 07/03/2015  Procedures    ____________________________________________   INITIAL IMPRESSION / ASSESSMENT AND PLAN / ED COURSE  Pertinent labs & imaging results that were available during my care of the patient were reviewed by me and considered in my medical decision making (see chart for details).   Patient is 67 year old female presents emergency department for evaluation of left-sided breast pain.  Physical exam shows a small cyst noted in the left breast. EKG is similar to previous EKGs.  Explained the findings to the patient.  Recommend she follow-up with her regular doctor for referral for a diagnostic mammogram with possible ultrasound due to the lesion noted in the left breast.  She is requesting a muscle relaxer to help with the muscle strain and was given baclofen.  She is to follow-up with her regular doctor.  She states she understands.  She will return if worsening.  She was discharged in stable condition     As part of my medical decision making, I reviewed the following data within the electronic MEDICAL RECORD NUMBER Nursing notes reviewed and incorporated, EKG interpreted NSR, Old EKG reviewed, Old chart reviewed, Notes from prior ED visits and [place] Controlled Substance Database  ____________________________________________   FINAL CLINICAL IMPRESSION(S) / ED DIAGNOSES  Final diagnoses:  Breast mass  Muscle strain      NEW MEDICATIONS STARTED DURING THIS VISIT:  New Prescriptions   BACLOFEN (LIORESAL) 10 MG TABLET    Take 1 tablet (10 mg total) by mouth 2 (two) times daily.     Note:  This document was prepared using Dragon voice recognition software and may include unintentional  dictation errors.    Faythe GheeFisher, Kristy Schomburg W, PA-C 05/06/18 1848    Myrna BlazerSchaevitz, David Matthew, MD 05/06/18 2252

## 2018-05-06 NOTE — Discharge Instructions (Signed)
Follow-up with your regular doctor.  Please call them and let them know that we recommend you have a diagnostic mammogram with ultrasound if needed.  If the area in your breast becomes larger please return the emergency department.  Use the muscle relaxer for the muscular pain that you fill.  Return if worsening.

## 2018-06-04 ENCOUNTER — Other Ambulatory Visit: Payer: Self-pay

## 2018-06-04 ENCOUNTER — Encounter: Payer: Self-pay | Admitting: Emergency Medicine

## 2018-06-04 ENCOUNTER — Emergency Department
Admission: EM | Admit: 2018-06-04 | Discharge: 2018-06-05 | Disposition: A | Discharge status: Home / Self Care | Payer: Medicare HMO | Attending: Emergency Medicine | Admitting: Emergency Medicine

## 2018-06-04 DIAGNOSIS — R1110 Vomiting, unspecified: Secondary | ICD-10-CM | POA: Diagnosis present

## 2018-06-04 DIAGNOSIS — K567 Ileus, unspecified: Secondary | ICD-10-CM | POA: Diagnosis not present

## 2018-06-04 DIAGNOSIS — I10 Essential (primary) hypertension: Secondary | ICD-10-CM | POA: Insufficient documentation

## 2018-06-04 DIAGNOSIS — R112 Nausea with vomiting, unspecified: Secondary | ICD-10-CM | POA: Insufficient documentation

## 2018-06-04 DIAGNOSIS — K5900 Constipation, unspecified: Secondary | ICD-10-CM | POA: Insufficient documentation

## 2018-06-04 DIAGNOSIS — Z79899 Other long term (current) drug therapy: Secondary | ICD-10-CM | POA: Diagnosis not present

## 2018-06-04 DIAGNOSIS — Z7982 Long term (current) use of aspirin: Secondary | ICD-10-CM | POA: Insufficient documentation

## 2018-06-04 LAB — URINALYSIS, COMPLETE (UACMP) WITH MICROSCOPIC
BILIRUBIN URINE: NEGATIVE
Bacteria, UA: NONE SEEN
GLUCOSE, UA: NEGATIVE mg/dL
HGB URINE DIPSTICK: NEGATIVE
Ketones, ur: NEGATIVE mg/dL
Leukocytes, UA: NEGATIVE
Nitrite: NEGATIVE
PROTEIN: NEGATIVE mg/dL
Specific Gravity, Urine: 1.001 — ABNORMAL LOW (ref 1.005–1.030)
pH: 7 (ref 5.0–8.0)

## 2018-06-04 LAB — COMPREHENSIVE METABOLIC PANEL
ALBUMIN: 3.7 g/dL (ref 3.5–5.0)
ALK PHOS: 92 U/L (ref 38–126)
ALT: 22 U/L (ref 0–44)
AST: 28 U/L (ref 15–41)
Anion gap: 9 (ref 5–15)
BILIRUBIN TOTAL: 1.1 mg/dL (ref 0.3–1.2)
BUN: 9 mg/dL (ref 8–23)
CO2: 25 mmol/L (ref 22–32)
CREATININE: 0.98 mg/dL (ref 0.44–1.00)
Calcium: 9.1 mg/dL (ref 8.9–10.3)
Chloride: 106 mmol/L (ref 98–111)
GFR calc Af Amer: >60 mL/min (ref >60)
GLUCOSE: 135 mg/dL — AB (ref 70–99)
Potassium: 3.7 mmol/L (ref 3.5–5.1)
Sodium: 140 mmol/L (ref 135–145)
TOTAL PROTEIN: 7.3 g/dL (ref 6.5–8.1)

## 2018-06-04 LAB — LIPASE, BLOOD: Lipase: 34 U/L (ref 11–51)

## 2018-06-04 LAB — CBC
HCT: 39.9 % (ref 36.0–46.0)
Hemoglobin: 13.4 g/dL (ref 12.0–15.0)
MCH: 31.3 pg (ref 26.0–34.0)
MCHC: 33.6 g/dL (ref 30.0–36.0)
MCV: 93.2 fL (ref 80.0–100.0)
PLATELETS: 261 10*3/uL (ref 150–400)
RBC: 4.28 MIL/uL (ref 3.87–5.11)
RDW: 12.5 % (ref 11.5–15.5)
WBC: 6.3 10*3/uL (ref 4.0–10.5)
nRBC: 0 % (ref 0.0–0.2)

## 2018-06-04 MED ORDER — ONDANSETRON HCL 4 MG/2ML IJ SOLN
4.0000 mg | Freq: Once | INTRAMUSCULAR | Status: AC
  Administered 2018-06-04: 4 mg via INTRAVENOUS
  Filled 2018-06-04: qty 2

## 2018-06-04 MED ORDER — IOPAMIDOL (ISOVUE-300) INJECTION 61%
30.0000 mL | Freq: Once | INTRAVENOUS | Status: AC
  Administered 2018-06-05: 30 mL via ORAL

## 2018-06-04 MED ORDER — SODIUM CHLORIDE 0.9 % IV BOLUS
1000.0000 mL | Freq: Once | INTRAVENOUS | Status: AC
  Administered 2018-06-04: 1000 mL via INTRAVENOUS

## 2018-06-04 NOTE — ED Triage Notes (Signed)
Patient ambulatory to triage with steady gait, without difficulty or distress noted; pt reports N/V since yesterday; denies pain

## 2018-06-04 NOTE — ED Provider Notes (Signed)
[facility] Emergency Department Provider Note   ____________________________________________   First MD Initiated Contact with Patient 06/04/18 2335     (approximate)  I have reviewed the triage vital signs and the nursing notes.   HISTORY  Chief Complaint Emesis    HPI Sandra Watts is a 68 y.o. female who presents to the ED from home with a chief complaint of nausea and vomiting.  Patient reports a 2 to 3-day history of nausea and vomiting.  States she was able to eat a banana today but the [place] and crackers she tried to eat came back up.  Denies associated fever, chills, chest pain, shortness of breath, abdominal pain, dysuria, diarrhea.  Passing gas.  Denies recent travel or trauma.   Past Medical History:  Diagnosis Date  . Arthritis   . Gout   . Gout   . Hypertension   . Musculoskeletal chest pain     There are no active problems to display for this patient.   Past Surgical History:  Procedure Laterality Date  . REPLACEMENT TOTAL KNEE      Prior to Admission medications   Medication Sig Start Date End Date Taking? Authorizing Provider  aspirin 81 MG tablet Take 81 mg by mouth daily.    [provider]  atorvastatin (LIPITOR) 40 MG tablet Take 40 mg by mouth daily. 04/15/15   [provider]  baclofen (LIORESAL) 10 MG tablet Take 1 tablet (10 mg total) by mouth 2 (two) times daily. 05/06/18 05/06/19  Fisher, Roselyn Bering, PA-C  docusate sodium (COLACE) 100 MG capsule Take 100 mg by mouth 2 (two) times daily.    [provider]  lisinopril (PRINIVIL,ZESTRIL) 10 MG tablet Take 10 mg by mouth daily.    [provider]  metoprolol tartrate (LOPRESSOR) 25 MG tablet Take 25 mg by mouth 2 (two) times daily.    [provider]  omeprazole (PRILOSEC) 20 MG capsule Take 20 mg by mouth daily.    [provider]    Allergies Penicillin g  Family History  Problem Relation Age of Onset  . Breast  cancer Cousin        paternal    Social History Social History   Tobacco Use  . Smoking status: Never Smoker  . Smokeless tobacco: Never Used  Substance Use Topics  . Alcohol use: Not Currently  . Drug use: Not on file    Review of Systems  Constitutional: No fever/chills Eyes: No visual changes. ENT: No sore throat. Cardiovascular: Denies chest pain. Respiratory: Denies shortness of breath. Gastrointestinal: No abdominal pain.  Positive for nausea and vomiting.  No diarrhea.  No constipation. Genitourinary: Negative for dysuria. Musculoskeletal: Negative for back pain. Skin: Negative for rash. Neurological: Negative for headaches, focal weakness or numbness.   ____________________________________________   PHYSICAL EXAM:  VITAL SIGNS: ED Triage Vitals  Enc Vitals Group     BP 06/04/18 2020 (!) 157/89     Pulse Rate 06/04/18 2020 75     Resp 06/04/18 2020 18     Temp 06/04/18 2020 98.4 F (36.9 C)     Temp Source 06/04/18 2020 Oral     SpO2 06/04/18 2020 100 %     Weight 06/04/18 2018 250 lb (113.4 kg)     Height 06/04/18 2018 5\' 7"  (1.702 m)     Head Circumference --      Peak Flow --      Pain Score 06/04/18 2018 0  Pain Loc --      Pain Edu? --      Excl. in GC? --     Constitutional: Alert and oriented. Well appearing and in no acute distress. Eyes: Conjunctivae are normal. PERRL. EOMI. Head: Atraumatic. Nose: No congestion/rhinnorhea. Mouth/Throat: Mucous membranes are moist.  Oropharynx non-erythematous. Neck: No stridor.   Cardiovascular: Normal rate, regular rhythm. Grossly normal heart sounds.  Good peripheral circulation. Respiratory: Normal respiratory effort.  No retractions. Lungs CTAB. Gastrointestinal: Soft and nontender to light or deep palpation. No distention. No abdominal bruits. No CVA tenderness. Musculoskeletal: No lower extremity tenderness nor edema.  No joint effusions. Neurologic:  Normal speech and language. No gross focal  neurologic deficits are appreciated. No gait instability. Skin:  Skin is warm, dry and intact. No rash noted. Psychiatric: Mood and affect are normal. Speech and behavior are normal.  ____________________________________________   LABS (all labs ordered are listed, but only abnormal results are displayed)  Labs Reviewed  COMPREHENSIVE METABOLIC PANEL - Abnormal; Notable for the following components:      Result Value   Glucose, Bld 135 (*)    All other components within normal limits  URINALYSIS, COMPLETE (UACMP) WITH MICROSCOPIC - Abnormal; Notable for the following components:   Color, Urine COLORLESS (*)    APPearance CLEAR (*)    Specific Gravity, Urine 1.001 (*)    All other components within normal limits  LIPASE, BLOOD  CBC  TROPONIN I   ____________________________________________  EKG  None ____________________________________________  RADIOLOGY  ED MD interpretation: No SBO, moderate stool burden  Official radiology report(s): Ct Abdomen Pelvis W Contrast  Result Date: 06/05/2018 CLINICAL DATA:  Nausea and vomiting without pain since yesterday. EXAM: CT ABDOMEN AND PELVIS WITH CONTRAST TECHNIQUE: Multidetector CT imaging of the abdomen and pelvis was performed using the standard protocol following bolus administration of intravenous contrast. CONTRAST:  30mL ISOVUE-300 IOPAMIDOL (ISOVUE-300) INJECTION 61%, 75mL OMNIPAQUE IOHEXOL 300 MG/ML SOLN COMPARISON:  None. FINDINGS: Lower chest: Included heart size is normal without pericardial effusion. Clear lung bases. Hepatobiliary: Homogeneous attenuation of the liver. Nondistended gallbladder free of stones. No biliary dilatation. Pancreas: Normal Spleen: Normal Adrenals/Urinary Tract: Normal Stomach/Bowel: Small hiatal hernia. Normal bowel rotation. No bowel obstruction. The distal and terminal ileum are normal. Contrast filled normal appendix. Moderate stool retention within the colon without bowel inflammation.  Vascular/Lymphatic: No significant vascular findings are present. No enlarged abdominal or pelvic lymph nodes. Reproductive: A few calcifications are noted within the uterus that may reflect small calcified fibroids. No adnexal mass. Other: Small periumbilical fat containing hernia. Right periumbilical subcutaneous fatty induration is stable possibly representing chronic fat necrosis. Musculoskeletal: Degenerative disc disease L3 through S1. No acute osseous abnormality. IMPRESSION: 1. No acute bowel obstruction or inflammation. Normal appendix. Moderate stool retention may reflect constipation. 2. Small hiatal hernia. 3. Degenerative disc disease L3 through S1. Electronically Signed   By: Tollie Ethavid  Kwon M.D.   On: 06/05/2018 01:17    ____________________________________________   PROCEDURES  Procedure(s) performed: None  Procedures  Critical Care performed: No  ____________________________________________   INITIAL IMPRESSION / ASSESSMENT AND PLAN / ED COURSE  As part of my medical decision making, I reviewed the following data within the electronic MEDICAL RECORD NUMBER History obtained from family, Nursing notes reviewed and incorporated, Labs reviewed, EKG interpreted, Old chart reviewed, Radiograph reviewed and Notes from prior ED visits   68 year old female with hypertension and no surgical history who presents with nausea and vomiting. Differential diagnosis includes, but is  not limited to, ovarian cyst, ovarian torsion, acute appendicitis, diverticulitis, urinary tract infection/pyelonephritis, endometriosis, bowel obstruction, colitis, renal colic, gastroenteritis, hernia, fibroids, endometriosis, etc.  Laboratory results unremarkable.  Will check troponin.  Although patient is not experiencing abdominal pain, given her symptoms of nausea/vomiting, will proceed with CT abdomen/pelvis to evaluate for intra-abdominal etiology.  Will initiate IV fluid resuscitation and administer 4 mg IV  Zofran for nausea. Clinical Course as of Jun 05 156  Tue Jun 05, 2018  0154 Updated patient and spouse of CT results.  Reviewed images with both.  Will discharge home with lactulose, Bentyl and Zofran to use as needed.  Will recommend good daily bowel regimen to regulate her bowels.  Patient was able to tolerate oral contrast without emesis.  Strict return precautions given.  Both verbalize understanding and agree with plan of care.   [JS]    Clinical Course User Index [JS] Irean Hong, MD     ____________________________________________   FINAL CLINICAL IMPRESSION(S) / ED DIAGNOSES  Final diagnoses:  Non-intractable vomiting with nausea, unspecified vomiting type  Constipation, unspecified constipation type  [facility])     ED Discharge Orders    None       Note:  This document was prepared using Dragon voice recognition software and may include unintentional dictation errors.    Irean Hong, MD 06/05/18 [phone]

## 2018-06-05 ENCOUNTER — Emergency Department: Payer: Medicare HMO

## 2018-06-05 ENCOUNTER — Encounter: Payer: Self-pay | Admitting: Radiology

## 2018-06-05 LAB — TROPONIN I: Troponin I: <0.03 ng/mL (ref <0.03)

## 2018-06-05 MED ORDER — LACTULOSE 10 GM/15ML PO SOLN: 20.0000 g | Freq: Every day | ORAL | 0 refills | Status: DC | PRN

## 2018-06-05 MED ORDER — DICYCLOMINE HCL 20 MG PO TABS: 20.0000 mg | ORAL_TABLET | Freq: Four times a day (QID) | ORAL | 0 refills | Status: DC | PRN

## 2018-06-05 MED ORDER — LACTULOSE 10 GM/15ML PO SOLN
30.0000 g | Freq: Once | ORAL | Status: AC
  Administered 2018-06-05: 30 g via ORAL
  Filled 2018-06-05: qty 60

## 2018-06-05 MED ORDER — ONDANSETRON 4 MG PO TBDP: 4.0000 mg | ORAL_TABLET | Freq: Three times a day (TID) | ORAL | 0 refills | Status: DC | PRN

## 2018-06-05 MED ORDER — ONDANSETRON 4 MG PO TBDP
4.0000 mg | ORAL_TABLET | Freq: Once | ORAL | Status: AC
  Administered 2018-06-05: 4 mg via ORAL
  Filled 2018-06-05: qty 1

## 2018-06-05 MED ORDER — IOHEXOL 300 MG/ML  SOLN
125.0000 mL | Freq: Once | INTRAMUSCULAR | Status: AC | PRN
  Administered 2018-06-05: 75 mL via INTRAVENOUS

## 2018-06-05 NOTE — Discharge Instructions (Addendum)
1.  Take laxative as needed for bowel movements (Lactulose). 2.  You may take medicines as needed for abdominal cramping/nausea (Bentyl/Zofran #20). 3.  Clear liquids x1 day, then bland diet x3 days, then slowly advance diet as tolerated. 4.  Take these over-the-counter medicines daily to regulate your bowel movements: MiraLAX Stool softeners 5.  Return to the ER for worsening symptoms, persistent vomiting, difficulty breathing or other concerns.

## 2018-06-15 ENCOUNTER — Other Ambulatory Visit: Payer: Self-pay | Admitting: Family

## 2018-06-15 DIAGNOSIS — N6320 Unspecified lump in the left breast, unspecified quadrant: Secondary | ICD-10-CM

## 2018-06-20 ENCOUNTER — Ambulatory Visit
Admission: RE | Admit: 2018-06-20 | Discharge: 2018-06-20 | Disposition: A | Discharge status: Home / Self Care | Payer: Medicare HMO | Source: Ambulatory Visit | Attending: Family | Admitting: Family

## 2018-06-20 DIAGNOSIS — N6320 Unspecified lump in the left breast, unspecified quadrant: Secondary | ICD-10-CM

## 2019-01-09 ENCOUNTER — Other Ambulatory Visit: Payer: Self-pay | Admitting: Family

## 2019-01-09 DIAGNOSIS — Z1231 Encounter for screening mammogram for malignant neoplasm of breast: Secondary | ICD-10-CM

## 2019-01-22 ENCOUNTER — Other Ambulatory Visit: Payer: Self-pay | Admitting: Family

## 2019-01-22 ENCOUNTER — Ambulatory Visit
Admission: RE | Admit: 2019-01-22 | Discharge: 2019-01-22 | Disposition: A | Discharge status: Home / Self Care | Payer: Medicare HMO | Source: Ambulatory Visit | Attending: Family | Admitting: Family

## 2019-01-22 DIAGNOSIS — Z1231 Encounter for screening mammogram for malignant neoplasm of breast: Secondary | ICD-10-CM | POA: Insufficient documentation

## 2019-01-25 ENCOUNTER — Other Ambulatory Visit: Payer: Self-pay | Admitting: Family

## 2019-01-25 DIAGNOSIS — G4452 New daily persistent headache (NDPH): Secondary | ICD-10-CM

## 2019-01-28 ENCOUNTER — Other Ambulatory Visit: Payer: Self-pay | Admitting: Family

## 2019-01-28 DIAGNOSIS — N6320 Unspecified lump in the left breast, unspecified quadrant: Secondary | ICD-10-CM

## 2019-01-28 DIAGNOSIS — R928 Other abnormal and inconclusive findings on diagnostic imaging of breast: Secondary | ICD-10-CM

## 2019-02-03 ENCOUNTER — Other Ambulatory Visit: Payer: Self-pay

## 2019-02-03 ENCOUNTER — Ambulatory Visit
Admission: RE | Admit: 2019-02-03 | Discharge: 2019-02-03 | Disposition: A | Discharge status: Home / Self Care | Payer: Medicare HMO | Source: Ambulatory Visit | Attending: Family | Admitting: Family

## 2019-02-03 DIAGNOSIS — G4452 New daily persistent headache (NDPH): Secondary | ICD-10-CM | POA: Insufficient documentation

## 2019-02-03 LAB — POCT I-STAT CREATININE: Creatinine, Ser: 1.3 mg/dL — ABNORMAL HIGH (ref 0.44–1.00)

## 2019-02-03 MED ORDER — GADOBUTROL 1 MMOL/ML IV SOLN
10.0000 mL | Freq: Once | INTRAVENOUS | Status: AC | PRN
  Administered 2019-02-03: 10 mL via INTRAVENOUS

## 2019-02-08 ENCOUNTER — Ambulatory Visit
Admission: RE | Admit: 2019-02-08 | Discharge: 2019-02-08 | Disposition: A | Discharge status: Home / Self Care | Payer: Medicare HMO | Source: Ambulatory Visit | Attending: Family | Admitting: Family

## 2019-02-08 DIAGNOSIS — N6320 Unspecified lump in the left breast, unspecified quadrant: Secondary | ICD-10-CM

## 2019-02-08 DIAGNOSIS — R928 Other abnormal and inconclusive findings on diagnostic imaging of breast: Secondary | ICD-10-CM

## 2019-02-18 ENCOUNTER — Emergency Department: Payer: Medicare HMO

## 2019-02-18 ENCOUNTER — Emergency Department
Admission: EM | Admit: 2019-02-18 | Discharge: 2019-02-18 | Disposition: A | Discharge status: Home / Self Care | Payer: Medicare HMO | Attending: Student in an Organized Health Care Education/Training Program | Admitting: Student in an Organized Health Care Education/Training Program

## 2019-02-18 ENCOUNTER — Encounter: Payer: Self-pay | Admitting: Emergency Medicine

## 2019-02-18 ENCOUNTER — Other Ambulatory Visit: Payer: Self-pay

## 2019-02-18 DIAGNOSIS — Z79899 Other long term (current) drug therapy: Secondary | ICD-10-CM | POA: Insufficient documentation

## 2019-02-18 DIAGNOSIS — Z96659 Presence of unspecified artificial knee joint: Secondary | ICD-10-CM | POA: Diagnosis not present

## 2019-02-18 DIAGNOSIS — Z7982 Long term (current) use of aspirin: Secondary | ICD-10-CM | POA: Diagnosis not present

## 2019-02-18 DIAGNOSIS — I10 Essential (primary) hypertension: Secondary | ICD-10-CM | POA: Diagnosis not present

## 2019-02-18 DIAGNOSIS — R079 Chest pain, unspecified: Secondary | ICD-10-CM | POA: Diagnosis present

## 2019-02-18 LAB — BASIC METABOLIC PANEL
Anion gap: 5 (ref 5–15)
BUN: 18 mg/dL (ref 8–23)
CO2: 27 mmol/L (ref 22–32)
Calcium: 8.9 mg/dL (ref 8.9–10.3)
Chloride: 108 mmol/L (ref 98–111)
Creatinine, Ser: 1.23 mg/dL — ABNORMAL HIGH (ref 0.44–1.00)
GFR calc Af Amer: 53 mL/min — ABNORMAL LOW (ref >60)
GFR calc non Af Amer: 46 mL/min — ABNORMAL LOW (ref >60)
Glucose, Bld: 136 mg/dL — ABNORMAL HIGH (ref 70–99)
Potassium: 4.2 mmol/L (ref 3.5–5.1)
Sodium: 140 mmol/L (ref 135–145)

## 2019-02-18 LAB — CBC
HCT: 37.1 % (ref 36.0–46.0)
Hemoglobin: 12.2 g/dL (ref 12.0–15.0)
MCH: 31.1 pg (ref 26.0–34.0)
MCHC: 32.9 g/dL (ref 30.0–36.0)
MCV: 94.6 fL (ref 80.0–100.0)
Platelets: 262 10*3/uL (ref 150–400)
RBC: 3.92 MIL/uL (ref 3.87–5.11)
RDW: 13.4 % (ref 11.5–15.5)
WBC: 9 10*3/uL (ref 4.0–10.5)
nRBC: 0 % (ref 0.0–0.2)

## 2019-02-18 LAB — TROPONIN I (HIGH SENSITIVITY)
Troponin I (High Sensitivity): 6 ng/L (ref <18)
Troponin I (High Sensitivity): 5 ng/L (ref <18)

## 2019-02-18 MED ORDER — SODIUM CHLORIDE 0.9% FLUSH: 3.0000 mL | Freq: Once | INTRAVENOUS | Status: DC

## 2019-02-18 MED ORDER — HYDROCODONE-ACETAMINOPHEN 5-325 MG PO TABS
2.0000 | ORAL_TABLET | Freq: Once | ORAL | Status: AC
  Administered 2019-02-18: 2 via ORAL
  Filled 2019-02-18: qty 2

## 2019-02-18 MED ORDER — TRAMADOL HCL 50 MG PO TABS
50.0000 mg | ORAL_TABLET | Freq: Once | ORAL | Status: AC
  Administered 2019-02-18: 50 mg via ORAL
  Filled 2019-02-18: qty 1

## 2019-02-18 NOTE — ED Notes (Signed)
Pt given ice water at this time.  

## 2019-02-18 NOTE — ED Notes (Signed)
Lab at the bedside 

## 2019-02-18 NOTE — ED Provider Notes (Signed)
[facility] Emergency Department Provider Note  ____________________________________________   First MD Initiated Contact with Patient 02/18/19 0200     (approximate)  I have reviewed the triage vital signs and the nursing notes.   HISTORY  Chief Complaint Chest Pain    HPI Sandra Watts is a 68 y.o. female    HPI: A 68 year old patient with a history of hypertension, hypercholesterolemia and obesity presents for evaluation of chest pain. Initial onset of pain was more than 6 hours ago. The patient's chest pain is well-localized, is described as heaviness/pressure/tightness and is not worse with exertion. The patient's chest pain is middle- or left-sided, is not sharp and does not radiate to the arms/jaw/neck. The patient does not complain of nausea and denies diaphoresis. The patient has no history of stroke, has no history of peripheral artery disease, has not smoked in the past 90 days, denies any history of treated diabetes and has no relevant family history of coronary artery disease (first degree relative at less than age 28).    The patient has no history of blood clots in her legs or lungs, no recent immobilizations, no long trips, no unilateral leg pain or swelling.  She denies fever/chills as well as shortness of breath and cough.  No contact with known COVID-19 patients.  Nothing in particular makes his symptoms better or worse and she describes them as anywhere from mild to moderate in intensity.  She received a full dose aspirin prior to arrival by EMS.  Of note, she has musculoskeletal chest pain listed in her past medical history.    Past Medical History:  Diagnosis Date  . Arthritis   . Hypertension   . Musculoskeletal chest pain     There are no active problems to display for this patient.   Past Surgical History:  Procedure Laterality Date  . APPENDECTOMY    . CARDIAC SURGERY     ablation  . REPLACEMENT TOTAL KNEE      Prior to  Admission medications   Medication Sig Start Date End Date Taking? Authorizing Provider  aspirin 81 MG tablet Take 81 mg by mouth daily.    [provider]  atorvastatin (LIPITOR) 40 MG tablet Take 40 mg by mouth daily. 04/15/15   [provider]  baclofen (LIORESAL) 10 MG tablet Take 1 tablet (10 mg total) by mouth 2 (two) times daily. 05/06/18 05/06/19  Fisher, Roselyn Bering, PA-C  dicyclomine (BENTYL) 20 MG tablet Take 1 tablet (20 mg total) by mouth every 6 (six) hours as needed. 06/05/18   Irean Hong, MD  docusate sodium (COLACE) 100 MG capsule Take 100 mg by mouth 2 (two) times daily.    [provider]  lactulose (CHRONULAC) 10 GM/15ML solution Take 30 mLs (20 g total) by mouth daily as needed for mild constipation. 06/05/18   Irean Hong, MD  lisinopril (PRINIVIL,ZESTRIL) 10 MG tablet Take 10 mg by mouth daily.    [provider]  metoprolol tartrate (LOPRESSOR) 25 MG tablet Take 25 mg by mouth 2 (two) times daily.    [provider]  omeprazole (PRILOSEC) 20 MG capsule Take 20 mg by mouth daily.    [provider]  ondansetron (ZOFRAN ODT) 4 MG disintegrating tablet Take 1 tablet (4 mg total) by mouth every 8 (eight) hours as needed for nausea or vomiting. 06/05/18   Irean Hong, MD    Allergies Penicillin g  Family History  Problem Relation Age of  Onset  . Breast cancer Cousin        paternal    Social History Social History   Tobacco Use  . Smoking status: Never Smoker  . Smokeless tobacco: Never Used  Substance Use Topics  . Alcohol use: Not Currently  . Drug use: Never    Review of Systems Constitutional: No fever/chills Eyes: No visual changes. ENT: No sore throat. Cardiovascular: Denies chest pain. Respiratory: Denies shortness of breath. Gastrointestinal: No abdominal pain.  No nausea, no vomiting.  No diarrhea.  No constipation. Genitourinary: Negative for dysuria. Musculoskeletal: Negative for neck pain.  Negative  for back pain. Integumentary: Negative for rash. Neurological: Negative for headaches, focal weakness or numbness.   ____________________________________________   PHYSICAL EXAM:  VITAL SIGNS: ED Triage Vitals  Enc Vitals Group     BP 02/18/19 0149 128/86     Pulse Rate 02/18/19 0149 87     Resp 02/18/19 0149 18     Temp 02/18/19 0149 98.6 F (37 C)     Temp Source 02/18/19 0149 Oral     SpO2 02/18/19 0149 99 %     Weight 02/18/19 0146 114.3 kg (252 lb)     Height 02/18/19 0146 1.715 m (5' 7.5")     Head Circumference --      Peak Flow --      Pain Score 02/18/19 0146 8     Pain Loc --      Pain Edu? --      Excl. in GC? --     Constitutional: Alert and oriented.  No acute distress, currently asymptomatic. Eyes: Conjunctivae are normal.  Head: Atraumatic. Nose: No congestion/rhinnorhea. Mouth/Throat: Mucous membranes are moist. Neck: No stridor.  No meningeal signs.   Cardiovascular: Normal rate, regular rhythm. Good peripheral circulation. Grossly normal heart sounds. Respiratory: Normal respiratory effort.  No retractions. Gastrointestinal: Soft and nontender. No distention.  Musculoskeletal: No lower extremity tenderness nor edema. No gross deformities of extremities.  Mildly reproducible chest wall tenderness on the left side. Neurologic:  Normal speech and language. No gross focal neurologic deficits are appreciated.  Skin:  Skin is warm, dry and intact. Psychiatric: Mood and affect are normal. Speech and behavior are normal.  ____________________________________________   LABS (all labs ordered are listed, but only abnormal results are displayed)  Labs Reviewed  BASIC METABOLIC PANEL - Abnormal; Notable for the following components:      Result Value   Glucose, Bld 136 (*)    Creatinine, Ser 1.23 (*)    GFR calc non Af Amer 46 (*)    GFR calc Af Amer 53 (*)    All other components within normal limits  CBC  TROPONIN I (HIGH SENSITIVITY)  TROPONIN I  (HIGH SENSITIVITY)   ____________________________________________  EKG  ED ECG REPORT I, Loleta Roseory Ludene Stokke, the attending physician, personally viewed and interpreted this ECG.  Date: 02/18/2019 EKG Time: 1:48 Rate: 90 Rhythm: normal sinus rhythm QRS Axis: normal Intervals: normal ST/T Wave abnormalities: Non-specific ST segment / T-wave changes, but no clear evidence of acute ischemia. Narrative Interpretation: no definitive evidence of acute ischemia; does not meet STEMI criteria.  ED ECG REPORT #2 I, Loleta Roseory Darryll Raju, the attending physician, personally viewed and interpreted this ECG.  Date: 02/18/19 EKG Time: 5:19 Rate: 81 Rhythm: normal sinus rhythm QRS Axis: normal Intervals: normal ST/T Wave abnormalities: Non-specific ST segment / T-wave changes, but no clear evidence of acute ischemia. Narrative Interpretation: no definitive evidence of acute ischemia; does not meet  STEMI criteria.  ____________________________________________  RADIOLOGY I, Loleta Roseory Zakiah Beckerman, personally viewed and evaluated these images (plain radiographs) as part of my medical decision making, as well as reviewing the written report by the radiologist.  ED MD interpretation: No acute chest findings on two-view chest x-ray  Official radiology report(s): Dg Chest 2 View  Result Date: 02/18/2019 CLINICAL DATA:  Chest pain. EXAM: CHEST - 2 VIEW COMPARISON:  07/03/2015 FINDINGS: The cardiomediastinal contours are normal. The lungs are clear. Pulmonary vasculature is normal. No consolidation, pleural effusion, or pneumothorax. No acute osseous abnormalities are seen. IMPRESSION: No acute chest findings. Electronically Signed   By: Narda RutherfordMelanie  Sanford M.D.   On: 02/18/2019 02:23    ____________________________________________   PROCEDURES   Procedure(s) performed (including Critical Care):  Procedures   ____________________________________________   INITIAL IMPRESSION / MDM / ASSESSMENT AND PLAN / ED COURSE   As part of my medical decision making, I reviewed the following data within the electronic MEDICAL RECORD NUMBER Nursing notes reviewed and incorporated, Labs reviewed , EKG interpreted , Old chart reviewed, Patient signed out to Dr. Roxan Hockeyobinson, Radiograph reviewed  and Notes from prior ED visits   Differential diagnosis includes, but is not limited to, musculoskeletal chest wall pain, costochondritis, myocarditis/pericarditis, ACS, PE, pneumonia.  The patient is well-appearing in no distress with stable and normal vital signs.  Lab work is notable for an essentially normal basic metabolic panel except for slightly elevated creatinine and a normal CBC.  Troponin has to be redrawn as it clotted the first time.  Low risk for ACS based on HEAR Score: 5 .  Wells score for PE of zero, no indication for d-dimer.  Anticipate discharge with outpatient follow up after two HS-troponins.   Clinical Course as of Feb 17 729  Mon Feb 18, 2019  [id number] Patient was reporting a recurrence of her chest pain.  I had an EKG repeated and is unchanged with no evidence of ischemia.   [CF]  0525 Initial troponin was 6.  We will continue with the plan for a repeat at [phone].  Troponin I (High Sensitivity): 6 [CF]  [id number] Transferring ED care to Dr. Roxan Hockeyobinson to follow up repeat troponin.  Anticipate discharge with outpatient follow up.   [CF]    Clinical Course User Index [CF] Loleta RoseForbach, Scharlene Catalina, MD     ____________________________________________  FINAL CLINICAL IMPRESSION(S) / ED DIAGNOSES  Final diagnoses:  Chest pain, unspecified type     MEDICATIONS GIVEN DURING THIS VISIT:  Medications  sodium chloride flush (NS) 0.9 % injection 3 mL (has no administration in time range)  HYDROcodone-acetaminophen (NORCO/VICODIN) 5-325 MG per tablet 2 tablet (2 tablets Oral Given 02/18/19 0606)     ED Discharge Orders    None      *Please note:  Brynda Rimatsy G Bouie was evaluated in Emergency Department on 02/18/2019 for the symptoms  described in the history of present illness. She was evaluated in the context of the global COVID-19 pandemic, which necessitated consideration that the patient might be at risk for infection with the SARS-CoV-2 virus that causes COVID-19. Institutional protocols and algorithms that pertain to the evaluation of patients at risk for COVID-19 are in a state of rapid change based on information released by regulatory bodies including the CDC and federal and state organizations. These policies and algorithms were followed during the patient's care in the ED.  Some ED evaluations and interventions may be delayed as a result of limited staffing during the pandemic.*  Note:  This  document was prepared using Systems analyst and may include unintentional dictation errors.   Hinda Kehr, MD 02/18/19 0730

## 2019-02-18 NOTE — ED Notes (Signed)
Lab at bedside

## 2019-02-18 NOTE — Discharge Instructions (Signed)

## 2019-02-18 NOTE — ED Provider Notes (Signed)
Patient received in signout from Dr. Karma Greaser pending repeat troponin.  Repeat troponin stable.  Patient remains hemodynamically stable and appropriate for outpatient follow-up.   Merlyn Lot, MD 02/18/19 [phone]

## 2019-02-18 NOTE — ED Notes (Signed)
Val, RN able to collect repeat troponin and send to lab.

## 2019-02-18 NOTE — ED Triage Notes (Addendum)
Patient brought in by ems from home. Patient with complaint of chest pain times 6 hours. Patient denies shortness of breath or nausea. 324 mg asa given by ems.

## 2019-02-18 NOTE — ED Notes (Signed)
Lab asked to complete the blood draw.

## 2019-02-18 NOTE — ED Notes (Signed)
Pt up to BR at this time.

## 2019-02-18 NOTE — ED Notes (Signed)
Per off-going RN, lab was called about collecting repeat troponin and report they should be up shortly after 7 to collect it.

## 2019-04-20 ENCOUNTER — Emergency Department
Admission: EM | Admit: 2019-04-20 | Discharge: 2019-04-20 | Disposition: A | Discharge status: Home / Self Care | Payer: Medicare HMO | Attending: Emergency Medicine | Admitting: Emergency Medicine

## 2019-04-20 ENCOUNTER — Emergency Department: Payer: Medicare HMO

## 2019-04-20 ENCOUNTER — Other Ambulatory Visit: Payer: Self-pay

## 2019-04-20 DIAGNOSIS — Z7982 Long term (current) use of aspirin: Secondary | ICD-10-CM | POA: Diagnosis not present

## 2019-04-20 DIAGNOSIS — I10 Essential (primary) hypertension: Secondary | ICD-10-CM | POA: Insufficient documentation

## 2019-04-20 DIAGNOSIS — R519 Headache, unspecified: Secondary | ICD-10-CM | POA: Insufficient documentation

## 2019-04-20 DIAGNOSIS — Z79899 Other long term (current) drug therapy: Secondary | ICD-10-CM | POA: Insufficient documentation

## 2019-04-20 DIAGNOSIS — Z96659 Presence of unspecified artificial knee joint: Secondary | ICD-10-CM | POA: Insufficient documentation

## 2019-04-20 LAB — COMPREHENSIVE METABOLIC PANEL
ALT: 16 U/L (ref 0–44)
AST: 22 U/L (ref 15–41)
Albumin: 3.7 g/dL (ref 3.5–5.0)
Alkaline Phosphatase: 114 U/L (ref 38–126)
Anion gap: 10 (ref 5–15)
BUN: 15 mg/dL (ref 8–23)
CO2: 27 mmol/L (ref 22–32)
Calcium: 9.7 mg/dL (ref 8.9–10.3)
Chloride: 104 mmol/L (ref 98–111)
Creatinine, Ser: 1.29 mg/dL — ABNORMAL HIGH (ref 0.44–1.00)
GFR calc Af Amer: 50 mL/min — ABNORMAL LOW (ref >60)
GFR calc non Af Amer: 43 mL/min — ABNORMAL LOW (ref >60)
Glucose, Bld: 155 mg/dL — ABNORMAL HIGH (ref 70–99)
Potassium: 4.2 mmol/L (ref 3.5–5.1)
Sodium: 141 mmol/L (ref 135–145)
Total Bilirubin: 0.8 mg/dL (ref 0.3–1.2)
Total Protein: 8.2 g/dL — ABNORMAL HIGH (ref 6.5–8.1)

## 2019-04-20 LAB — CBC WITH DIFFERENTIAL/PLATELET
Abs Immature Granulocytes: 0.02 10*3/uL (ref 0.00–0.07)
Basophils Absolute: 0 10*3/uL (ref 0.0–0.1)
Basophils Relative: 1 %
Eosinophils Absolute: 0.3 10*3/uL (ref 0.0–0.5)
Eosinophils Relative: 4 %
HCT: 36.4 % (ref 36.0–46.0)
Hemoglobin: 12 g/dL (ref 12.0–15.0)
Immature Granulocytes: 0 %
Lymphocytes Relative: 31 %
Lymphs Abs: 2 10*3/uL (ref 0.7–4.0)
MCH: 31.3 pg (ref 26.0–34.0)
MCHC: 33 g/dL (ref 30.0–36.0)
MCV: 94.8 fL (ref 80.0–100.0)
Monocytes Absolute: 0.6 10*3/uL (ref 0.1–1.0)
Monocytes Relative: 10 %
Neutro Abs: 3.5 10*3/uL (ref 1.7–7.7)
Neutrophils Relative %: 54 %
Platelets: 235 10*3/uL (ref 150–400)
RBC: 3.84 MIL/uL — ABNORMAL LOW (ref 3.87–5.11)
RDW: 13.1 % (ref 11.5–15.5)
WBC: 6.4 10*3/uL (ref 4.0–10.5)
nRBC: 0 % (ref 0.0–0.2)

## 2019-04-20 MED ORDER — DEXAMETHASONE SODIUM PHOSPHATE 10 MG/ML IJ SOLN
10.0000 mg | Freq: Once | INTRAMUSCULAR | Status: AC
  Administered 2019-04-20: 10 mg via INTRAVENOUS
  Filled 2019-04-20: qty 1

## 2019-04-20 MED ORDER — SODIUM CHLORIDE 0.9 % IV BOLUS
500.0000 mL | Freq: Once | INTRAVENOUS | Status: AC
  Administered 2019-04-20: 500 mL via INTRAVENOUS

## 2019-04-20 MED ORDER — TRAMADOL HCL 50 MG PO TABS: 50.0000 mg | ORAL_TABLET | Freq: Four times a day (QID) | ORAL | 0 refills | Status: AC | PRN

## 2019-04-20 MED ORDER — TRAMADOL HCL 50 MG PO TABS
50.0000 mg | ORAL_TABLET | Freq: Once | ORAL | Status: AC
  Administered 2019-04-20: 16:00:00 50 mg via ORAL
  Filled 2019-04-20: qty 1

## 2019-04-20 NOTE — ED Triage Notes (Signed)
FIRST NURSE NOTE:  Pt c/o headache upon arrival to registration desk.  Pt is alert and oriented, ambulatory without difficulty. Speech clear.

## 2019-04-20 NOTE — Discharge Instructions (Signed)
Tramadol has been prescribed for your headaches at home. Make sure you are staying hydrated during the day.  Return to the ED with new or worsening symptoms.

## 2019-04-20 NOTE — ED Provider Notes (Signed)
[facility] Emergency Department Provider Note  ____________________________________________  Time seen: Approximately 3:31 PM  I have reviewed the triage vital signs and the nursing notes.   HISTORY  Chief Complaint Headache    HPI Sandra Watts is a 68 y.o. female with a history of hypertension, presents to the emergency department with a right parietal headache that patient states that she awoke with yesterday morning at 8 AM.  Patient states that the headache slowly progressed in intensity.  It is not associated with phonophobia, photophobia, blurry vision or vertigo.  Patient denies temporal pain or difficulty opening or closing the jaw.  She states that pain was relieved with Tylenol but did not completely go away.  She states that she has a history of migraines when she was younger but does not have a weekly or even monthly migraine now.  Patient denies falls or traumas.  She has had no fever at home.  She denies neck pain or difficulty swallowing.  No weakness of the upper and lower extremities.  She denies blood thinner usage.  No other alleviating measures have been attempted.        Past Medical History:  Diagnosis Date  . Arthritis   . Hypertension   . Musculoskeletal chest pain     There are no active problems to display for this patient.   Past Surgical History:  Procedure Laterality Date  . APPENDECTOMY    . CARDIAC SURGERY     ablation  . REPLACEMENT TOTAL KNEE      Prior to Admission medications   Medication Sig Start Date End Date Taking? Authorizing Provider  aspirin 81 MG tablet Take 81 mg by mouth daily.    [provider]  atorvastatin (LIPITOR) 40 MG tablet Take 40 mg by mouth daily. 04/15/15   [provider]  baclofen (LIORESAL) 10 MG tablet Take 1 tablet (10 mg total) by mouth 2 (two) times daily. 05/06/18 05/06/19  Fisher, Linden Dolin, PA-C  dicyclomine (BENTYL) 20 MG tablet Take 1 tablet (20 mg total) by mouth  every 6 (six) hours as needed. 06/05/18   Paulette Blanch, MD  docusate sodium (COLACE) 100 MG capsule Take 100 mg by mouth 2 (two) times daily.    [provider]  lactulose (CHRONULAC) 10 GM/15ML solution Take 30 mLs (20 g total) by mouth daily as needed for mild constipation. 06/05/18   Paulette Blanch, MD  lisinopril (PRINIVIL,ZESTRIL) 10 MG tablet Take 10 mg by mouth daily.    [provider]  metoprolol tartrate (LOPRESSOR) 25 MG tablet Take 25 mg by mouth 2 (two) times daily.    [provider]  omeprazole (PRILOSEC) 20 MG capsule Take 20 mg by mouth daily.    [provider]  ondansetron (ZOFRAN ODT) 4 MG disintegrating tablet Take 1 tablet (4 mg total) by mouth every 8 (eight) hours as needed for nausea or vomiting. 06/05/18   Paulette Blanch, MD  traMADol (ULTRAM) 50 MG tablet Take 1 tablet (50 mg total) by mouth every 6 (six) hours as needed for up to 3 days. 04/20/19 04/23/19  Lannie Fields, PA-C    Allergies Penicillin g  Family History  Problem Relation Age of Onset  . Breast cancer Cousin        paternal    Social History Social History   Tobacco Use  . Smoking status: Never Smoker  . Smokeless tobacco: Never Used  Substance Use Topics  . Alcohol use:  Not Currently  . Drug use: Never     Review of Systems  Constitutional: No fever/chills Eyes: No visual changes. No discharge ENT: No upper respiratory complaints. Cardiovascular: no chest pain. Respiratory: no cough. No SOB. Gastrointestinal: No abdominal pain.  No nausea, no vomiting.  No diarrhea.  No constipation. Genitourinary: Negative for dysuria. No hematuria Musculoskeletal: Negative for musculoskeletal pain. Skin: Negative for rash, abrasions, lacerations, ecchymosis. Neurological: Patient has headache, no focal weakness or numbness.   ____________________________________________   PHYSICAL EXAM:  VITAL SIGNS: ED Triage Vitals  Enc Vitals Group     BP 04/20/19 1330 (!)  145/81     Pulse Rate 04/20/19 1330 84     Resp 04/20/19 1330 17     Temp 04/20/19 1330 98.5 F (36.9 C)     Temp Source 04/20/19 1330 Oral     SpO2 04/20/19 1330 100 %     Weight 04/20/19 1330 240 lb (108.9 kg)     Height 04/20/19 1330 5\' 7"  (1.702 m)     Head Circumference --      Peak Flow --      Pain Score 04/20/19 1335 10     Pain Loc --      Pain Edu? --      Excl. in GC? --      Constitutional: Alert and oriented. Well appearing and in no acute distress. Eyes: Conjunctivae are normal. PERRL. EOMI. Head: Atraumatic. ENT:      Nose: No congestion/rhinnorhea.      Mouth/Throat: Mucous membranes are moist.  Neck: No stridor.  No cervical spine tenderness to palpation. Cardiovascular: Normal rate, regular rhythm. Normal S1 and S2.  Good peripheral circulation. Respiratory: Normal respiratory effort without tachypnea or retractions. Lungs CTAB. Good air entry to the bases with no decreased or absent breath sounds. Musculoskeletal: Full range of motion to all extremities. No gross deformities appreciated. Neurologic:  Normal speech and language. No gross focal neurologic deficits are appreciated.  Skin:  Skin is warm, dry and intact. No rash noted. Psychiatric: Mood and affect are normal. Speech and behavior are normal. Patient exhibits appropriate insight and judgement.   ____________________________________________   LABS (all labs ordered are listed, but only abnormal results are displayed)  Labs Reviewed  CBC WITH DIFFERENTIAL/PLATELET - Abnormal; Notable for the following components:      Result Value   RBC 3.84 (*)    All other components within normal limits  COMPREHENSIVE METABOLIC PANEL - Abnormal; Notable for the following components:   Glucose, Bld 155 (*)    Creatinine, Ser 1.29 (*)    Total Protein 8.2 (*)    GFR calc non Af Amer 43 (*)    GFR calc Af Amer 50 (*)    All other components within normal limits  URINALYSIS, COMPLETE (UACMP) WITH MICROSCOPIC    ____________________________________________  EKG   ____________________________________________  RADIOLOGY I personally viewed and evaluated these images as part of my medical decision making, as well as reviewing the written report by the radiologist.  Ct Head Wo Contrast  Result Date: 04/20/2019 CLINICAL DATA:  68 year old female with acute severe right-sided headache. EXAM: CT HEAD WITHOUT CONTRAST TECHNIQUE: Contiguous axial images were obtained from the base of the skull through the vertex without intravenous contrast. COMPARISON:  None. FINDINGS: Brain: No evidence of acute infarction, hemorrhage, hydrocephalus, extra-axial collection or mass lesion/mass effect. Vascular: No hyperdense vessel or unexpected calcification. Skull: Normal. Negative for fracture or focal lesion. Sinuses/Orbits: No acute finding. Other:  None. IMPRESSION: Negative head CT. Electronically Signed   By: Malachy MoanHeath  McCullough M.D.   On: 04/20/2019 14:15    ____________________________________________    PROCEDURES  Procedure(s) performed:    Procedures    Medications  traMADol (ULTRAM) tablet 50 mg (50 mg Oral Given 04/20/19 1552)  dexamethasone (DECADRON) injection 10 mg (10 mg Intravenous Given 04/20/19 1552)  sodium chloride 0.9 % bolus 500 mL (500 mLs Intravenous New Bag/Given 04/20/19 1551)     ____________________________________________   INITIAL IMPRESSION / ASSESSMENT AND PLAN / ED COURSE  Pertinent labs & imaging results that were available during my care of the patient were reviewed by me and considered in my medical decision making (see chart for details).  Review of the  [place] was performed in accordance of the [place] prior to dispensing any controlled drugs.           Assessment and Plan:  Headache:  68 year old female presents to the emergency department with a right-sided stabbing parietal headache that has occurred for over 24 hours.  On physical exam, patient is  mildly hypertensive but appears to be resting comfortably.  Her neuro exam is reassuring without acute deficits.  Differential diagnosis included subdural hematoma, subarachnoid hemorrhage, migraine, dehydration, electrolyte abnormality..  Basic labs were obtained in the emergency department and were reassuring.  Patient's creatinine was mildly elevated but a review of prior labs indicate that this is baseline for patient.  CT head revealed no acute abnormality.  Patient reported that her headache had improved by more than 50% after tramadol, supplemental fluids and Decadron were administered in the ED.  Patient states that she felt well enough to go home.  Discharge patient with a short course of tramadol and gave her strict return precautions to return to the emergency department with new or worsening symptoms.  She voiced understanding. ____________________________________________  FINAL CLINICAL IMPRESSION(S) / ED DIAGNOSES  Final diagnoses:  Acute nonintractable headache, unspecified headache type      NEW MEDICATIONS STARTED DURING THIS VISIT:  ED Discharge Orders         Ordered    traMADol (ULTRAM) 50 MG tablet  Every 6 hours PRN     04/20/19 1642              This chart was dictated using voice recognition software/Dragon. Despite best efforts to proofread, errors can occur which can change the meaning. Any change was purely unintentional.    Orvil FeilWoods, Bryce Cheever M, PA-C 04/20/19 1650    Emily FilbertWilliams, Jonathan E, MD 04/20/19 1754

## 2019-04-20 NOTE — ED Triage Notes (Signed)
R sided headache since yesterday. States is worst headache she has ever had. Denies fall or injury. No other deficits noted. Denies use of blood thinners except ASA.

## 2019-09-06 ENCOUNTER — Other Ambulatory Visit: Payer: Self-pay

## 2019-09-06 ENCOUNTER — Ambulatory Visit (LOCAL_COMMUNITY_HEALTH_CENTER): Payer: Medicare HMO

## 2019-09-06 DIAGNOSIS — Z111 Encounter for screening for respiratory tuberculosis: Secondary | ICD-10-CM

## 2019-09-09 ENCOUNTER — Ambulatory Visit (LOCAL_COMMUNITY_HEALTH_CENTER): Payer: Self-pay

## 2019-09-09 ENCOUNTER — Other Ambulatory Visit: Payer: Self-pay

## 2019-09-09 DIAGNOSIS — Z111 Encounter for screening for respiratory tuberculosis: Secondary | ICD-10-CM

## 2019-09-09 LAB — TB SKIN TEST
Induration: 0 mm
TB Skin Test: NEGATIVE

## 2019-09-21 ENCOUNTER — Ambulatory Visit: Payer: Medicare HMO | Attending: Internal Medicine

## 2019-09-21 DIAGNOSIS — Z23 Encounter for immunization: Secondary | ICD-10-CM

## 2019-09-21 NOTE — Progress Notes (Signed)
   Covid-19 Vaccination Clinic  Name:  Sandra Watts    MRN: [id number] DOB: 05-31-1950  09/21/2019  Sandra Watts was observed post Covid-19 immunization for 30 minutes based on pre-vaccination screening without incident. She was provided with Vaccine Information Sheet and instruction to access the V-Safe system.   Sandra Watts was instructed to call 911 with any severe reactions post vaccine: Marland Kitchen Difficulty breathing  . Swelling of face and throat  . A fast heartbeat  . A bad rash all over body  . Dizziness and weakness   Immunizations Administered    Name Date Dose VIS Date Route   Pfizer COVID-19 Vaccine 09/21/2019  1:39 PM 0.3 mL 07/24/2018 Intramuscular   Manufacturer: [employer], [employer]   Lot: [id number]   NDC: [address]-4

## 2019-10-03 ENCOUNTER — Telehealth: Payer: Self-pay | Admitting: Family Medicine

## 2019-10-03 NOTE — Telephone Encounter (Signed)
She needs a copy of her TB test results, she lost the form. Call when ready thanks

## 2019-10-04 NOTE — Telephone Encounter (Signed)
LM for patient to stop by and get TB skin test form at info booth. Richmond Campbell, RN

## 2019-10-15 ENCOUNTER — Ambulatory Visit: Payer: Medicare HMO | Attending: Internal Medicine

## 2019-10-15 DIAGNOSIS — Z23 Encounter for immunization: Secondary | ICD-10-CM

## 2019-10-15 NOTE — Progress Notes (Signed)
   Covid-19 Vaccination Clinic  Name:  Sandra Watts    MRN: [id number] DOB: 05-31-1950  10/15/2019  [name] was observed post Covid-19 immunization for 15 minutes without incident. She was provided with Vaccine Information Sheet and instruction to access the V-Safe system.   [name] was instructed to call 911 with any severe reactions post vaccine: Marland Kitchen Difficulty breathing  . Swelling of face and throat  . A fast heartbeat  . A bad rash all over body  . Dizziness and weakness   Immunizations Administered    Name Date Dose VIS Date Route   Pfizer COVID-19 Vaccine 10/15/2019  1:42 PM 0.3 mL 07/24/2018 Intramuscular   Manufacturer: [employer], [employer]   Lot: [id number]   NDC: [address]-0

## 2019-11-21 ENCOUNTER — Other Ambulatory Visit: Payer: Self-pay | Admitting: Family

## 2019-11-21 DIAGNOSIS — N644 Mastodynia: Secondary | ICD-10-CM

## 2019-11-22 ENCOUNTER — Other Ambulatory Visit: Payer: Self-pay | Admitting: Family

## 2019-11-22 DIAGNOSIS — N644 Mastodynia: Secondary | ICD-10-CM

## 2019-11-22 DIAGNOSIS — N63 Unspecified lump in unspecified breast: Secondary | ICD-10-CM

## 2019-12-03 ENCOUNTER — Ambulatory Visit
Admission: RE | Admit: 2019-12-03 | Discharge: 2019-12-03 | Disposition: A | Discharge status: Home / Self Care | Payer: Medicare HMO | Source: Ambulatory Visit | Attending: Family | Admitting: Family

## 2019-12-03 DIAGNOSIS — N644 Mastodynia: Secondary | ICD-10-CM | POA: Diagnosis present

## 2019-12-03 DIAGNOSIS — N63 Unspecified lump in unspecified breast: Secondary | ICD-10-CM

## 2020-01-01 ENCOUNTER — Other Ambulatory Visit: Payer: Self-pay | Admitting: Family

## 2020-01-01 DIAGNOSIS — Z1231 Encounter for screening mammogram for malignant neoplasm of breast: Secondary | ICD-10-CM

## 2020-01-23 ENCOUNTER — Other Ambulatory Visit: Payer: Self-pay

## 2020-01-23 ENCOUNTER — Ambulatory Visit
Admission: RE | Admit: 2020-01-23 | Discharge: 2020-01-23 | Disposition: A | Discharge status: Home / Self Care | Payer: Medicare HMO | Source: Ambulatory Visit | Attending: Family | Admitting: Family

## 2020-01-23 DIAGNOSIS — Z1231 Encounter for screening mammogram for malignant neoplasm of breast: Secondary | ICD-10-CM | POA: Diagnosis not present

## 2020-09-13 ENCOUNTER — Other Ambulatory Visit: Payer: Self-pay

## 2020-09-13 DIAGNOSIS — Z5321 Procedure and treatment not carried out due to patient leaving prior to being seen by health care provider: Secondary | ICD-10-CM | POA: Insufficient documentation

## 2020-09-13 DIAGNOSIS — R238 Other skin changes: Secondary | ICD-10-CM | POA: Insufficient documentation

## 2020-09-13 DIAGNOSIS — R519 Headache, unspecified: Secondary | ICD-10-CM | POA: Insufficient documentation

## 2020-09-13 DIAGNOSIS — M542 Cervicalgia: Secondary | ICD-10-CM | POA: Insufficient documentation

## 2020-09-13 NOTE — ED Triage Notes (Signed)
'  R side of head into neck pain since this morning, its either my bad teeth or bacterial infection, skin smells funny after using leg brace because I didn't wash it and the pain in mouth goes away with Listerine.' pain increases with touch.

## 2020-09-14 ENCOUNTER — Emergency Department
Admission: EM | Admit: 2020-09-14 | Discharge: 2020-09-14 | Disposition: A | Discharge status: Home / Self Care | Payer: Medicare HMO | Attending: Emergency Medicine | Admitting: Emergency Medicine

## 2020-09-14 NOTE — ED Notes (Signed)
Pt states she is leaving. Pt encouraged to stay and be seen. Pt verbalizes understanding, but states she is going to see her doctor tomorrow.

## 2020-11-02 DIAGNOSIS — M898X9 Other specified disorders of bone, unspecified site: Secondary | ICD-10-CM | POA: Diagnosis not present

## 2020-11-02 DIAGNOSIS — M2041 Other hammer toe(s) (acquired), right foot: Secondary | ICD-10-CM | POA: Diagnosis not present

## 2020-11-02 DIAGNOSIS — M778 Other enthesopathies, not elsewhere classified: Secondary | ICD-10-CM | POA: Diagnosis not present

## 2020-11-02 DIAGNOSIS — M2042 Other hammer toe(s) (acquired), left foot: Secondary | ICD-10-CM | POA: Diagnosis not present

## 2020-11-10 ENCOUNTER — Emergency Department
Admission: EM | Admit: 2020-11-10 | Discharge: 2020-11-11 | Disposition: A | Discharge status: Home / Self Care | Payer: Medicare HMO | Attending: Emergency Medicine | Admitting: Emergency Medicine

## 2020-11-10 ENCOUNTER — Other Ambulatory Visit: Payer: Self-pay

## 2020-11-10 ENCOUNTER — Emergency Department: Payer: Medicare HMO

## 2020-11-10 DIAGNOSIS — K5900 Constipation, unspecified: Secondary | ICD-10-CM

## 2020-11-10 DIAGNOSIS — K429 Umbilical hernia without obstruction or gangrene: Secondary | ICD-10-CM | POA: Diagnosis not present

## 2020-11-10 DIAGNOSIS — Z9049 Acquired absence of other specified parts of digestive tract: Secondary | ICD-10-CM | POA: Diagnosis not present

## 2020-11-10 DIAGNOSIS — I10 Essential (primary) hypertension: Secondary | ICD-10-CM | POA: Diagnosis not present

## 2020-11-10 DIAGNOSIS — Z7982 Long term (current) use of aspirin: Secondary | ICD-10-CM | POA: Insufficient documentation

## 2020-11-10 DIAGNOSIS — R1031 Right lower quadrant pain: Secondary | ICD-10-CM | POA: Diagnosis not present

## 2020-11-10 DIAGNOSIS — K449 Diaphragmatic hernia without obstruction or gangrene: Secondary | ICD-10-CM | POA: Diagnosis not present

## 2020-11-10 DIAGNOSIS — Z96659 Presence of unspecified artificial knee joint: Secondary | ICD-10-CM | POA: Diagnosis not present

## 2020-11-10 DIAGNOSIS — Z79899 Other long term (current) drug therapy: Secondary | ICD-10-CM | POA: Insufficient documentation

## 2020-11-10 LAB — COMPREHENSIVE METABOLIC PANEL
ALT: 20 U/L (ref 0–44)
AST: 23 U/L (ref 15–41)
Albumin: 3.5 g/dL (ref 3.5–5.0)
Alkaline Phosphatase: 85 U/L (ref 38–126)
Anion gap: 5 (ref 5–15)
BUN: 19 mg/dL (ref 8–23)
CO2: 28 mmol/L (ref 22–32)
Calcium: 9 mg/dL (ref 8.9–10.3)
Chloride: 104 mmol/L (ref 98–111)
Creatinine, Ser: 1.31 mg/dL — ABNORMAL HIGH (ref 0.44–1.00)
GFR, Estimated: 45 mL/min — ABNORMAL LOW (ref >60)
Glucose, Bld: 103 mg/dL — ABNORMAL HIGH (ref 70–99)
Potassium: 3.8 mmol/L (ref 3.5–5.1)
Sodium: 137 mmol/L (ref 135–145)
Total Bilirubin: 0.5 mg/dL (ref 0.3–1.2)
Total Protein: 7.6 g/dL (ref 6.5–8.1)

## 2020-11-10 LAB — URINALYSIS, COMPLETE (UACMP) WITH MICROSCOPIC
Bacteria, UA: NONE SEEN
Bilirubin Urine: NEGATIVE
Glucose, UA: >=500 mg/dL — AB
Hgb urine dipstick: NEGATIVE
Ketones, ur: NEGATIVE mg/dL
Leukocytes,Ua: NEGATIVE
Nitrite: NEGATIVE
Protein, ur: NEGATIVE mg/dL
Specific Gravity, Urine: 1.002 — ABNORMAL LOW (ref 1.005–1.030)
Squamous Epithelial / HPF: NONE SEEN (ref 0–5)
pH: 7 (ref 5.0–8.0)

## 2020-11-10 LAB — CBC
HCT: 35.4 % — ABNORMAL LOW (ref 36.0–46.0)
Hemoglobin: 11.6 g/dL — ABNORMAL LOW (ref 12.0–15.0)
MCH: 31.4 pg (ref 26.0–34.0)
MCHC: 32.8 g/dL (ref 30.0–36.0)
MCV: 95.9 fL (ref 80.0–100.0)
Platelets: 259 10*3/uL (ref 150–400)
RBC: 3.69 MIL/uL — ABNORMAL LOW (ref 3.87–5.11)
RDW: 14.4 % (ref 11.5–15.5)
WBC: 6.2 10*3/uL (ref 4.0–10.5)
nRBC: 0 % (ref 0.0–0.2)

## 2020-11-10 LAB — LIPASE, BLOOD: Lipase: 41 U/L (ref 11–51)

## 2020-11-10 MED ORDER — IOHEXOL 9 MG/ML PO SOLN
500.0000 mL | ORAL | Status: DC
  Administered 2020-11-10: 500 mL via ORAL

## 2020-11-10 NOTE — ED Notes (Signed)
Patient transported to CT 

## 2020-11-10 NOTE — Discharge Instructions (Addendum)

## 2020-11-10 NOTE — ED Provider Notes (Signed)
[facility] Emergency Department Provider Note   ____________________________________________   Event Date/Time   First MD Initiated Contact with Patient 11/10/20 2231     (approximate)  I have reviewed the triage vital signs and the nursing notes.   HISTORY  Chief Complaint Abdominal Pain    HPI Sandra Watts is a 71 y.o. female with past medical history of hypertension and arthritis who presents to the ED complaining of abdominal pain.  Patient reports that since she woke up this morning she has been dealing with pain in her lower abdomen.  She describes the pain as sharp and constant, has seemed to worsen in waves at times but is not exacerbated or alleviated by anything in particular.  Pain seems to be worse in the right lower quadrant compared to the left, but she states she is status post appendectomy.  She denies any dysuria, hematuria, diarrhea, or constipation.  She has not had any fevers, nausea, or vomiting.  She denies any history of similar symptoms in the past.        Past Medical History:  Diagnosis Date   Arthritis    Hypertension    Musculoskeletal chest pain     There are no problems to display for this patient.   Past Surgical History:  Procedure Laterality Date   APPENDECTOMY     CARDIAC SURGERY     ablation   REPLACEMENT TOTAL KNEE      Prior to Admission medications   Medication Sig Start Date End Date Taking? Authorizing Provider  aspirin 81 MG tablet Take 81 mg by mouth daily.    [provider]  atorvastatin (LIPITOR) 40 MG tablet Take 40 mg by mouth daily. 04/15/15   [provider]  dicyclomine (BENTYL) 20 MG tablet Take 1 tablet (20 mg total) by mouth every 6 (six) hours as needed. 06/05/18   Irean Hong, MD  docusate sodium (COLACE) 100 MG capsule Take 100 mg by mouth 2 (two) times daily.    [provider]  lactulose (CHRONULAC) 10 GM/15ML solution Take 30 mLs (20 g total) by mouth daily  as needed for mild constipation. 06/05/18   Irean Hong, MD  lisinopril (PRINIVIL,ZESTRIL) 10 MG tablet Take 10 mg by mouth daily.    [provider]  metoprolol tartrate (LOPRESSOR) 25 MG tablet Take 25 mg by mouth 2 (two) times daily.    [provider]  omeprazole (PRILOSEC) 20 MG capsule Take 20 mg by mouth daily.    [provider]  ondansetron (ZOFRAN ODT) 4 MG disintegrating tablet Take 1 tablet (4 mg total) by mouth every 8 (eight) hours as needed for nausea or vomiting. 06/05/18   Irean Hong, MD    Allergies Penicillin g  Family History  Problem Relation Age of Onset   Breast cancer Cousin        paternal    Social History Social History   Tobacco Use   Smoking status: Never   Smokeless tobacco: Never  Substance Use Topics   Alcohol use: Not Currently   Drug use: Never    Review of Systems  Constitutional: No fever/chills Eyes: No visual changes. ENT: No sore throat. Cardiovascular: Denies chest pain. Respiratory: Denies shortness of breath. Gastrointestinal: Positive for abdominal pain.  No nausea, no vomiting.  No diarrhea.  No constipation. Genitourinary: Negative for dysuria. Musculoskeletal: Negative for back pain. Skin: Negative for rash. Neurological: Negative for headaches, focal weakness or numbness.  ____________________________________________  PHYSICAL EXAM:  VITAL SIGNS: ED Triage Vitals [11/10/20 2017]  Enc Vitals Group     BP 132/79     Pulse Rate 66     Resp 16     Temp 98.3 F (36.8 C)     Temp Source Oral     SpO2 100 %     Weight      Height      Head Circumference      Peak Flow      Pain Score 0     Pain Loc      Pain Edu?      Excl. in GC?     Constitutional: Alert and oriented. Eyes: Conjunctivae are normal. Head: Atraumatic. Nose: No congestion/rhinnorhea. Mouth/Throat: Mucous membranes are moist. Neck: Normal ROM Cardiovascular: Normal rate, regular rhythm. Grossly normal heart  sounds. Respiratory: Normal respiratory effort.  No retractions. Lungs CTAB. Gastrointestinal: Soft and tender to palpation in the bilateral lower quadrants with no rebound or guarding.  No distention. Genitourinary: deferred Musculoskeletal: No lower extremity tenderness nor edema. Neurologic:  Normal speech and language. No gross focal neurologic deficits are appreciated. Skin:  Skin is warm, dry and intact. No rash noted. Psychiatric: Mood and affect are normal. Speech and behavior are normal.  ____________________________________________   LABS (all labs ordered are listed, but only abnormal results are displayed)  Labs Reviewed  COMPREHENSIVE METABOLIC PANEL - Abnormal; Notable for the following components:      Result Value   Glucose, Bld 103 (*)    Creatinine, Ser 1.31 (*)    GFR, Estimated 45 (*)    All other components within normal limits  CBC - Abnormal; Notable for the following components:   RBC 3.69 (*)    Hemoglobin 11.6 (*)    HCT 35.4 (*)    All other components within normal limits  URINALYSIS, COMPLETE (UACMP) WITH MICROSCOPIC - Abnormal; Notable for the following components:   Color, Urine COLORLESS (*)    APPearance HAZY (*)    Specific Gravity, Urine 1.002 (*)    Glucose, UA >=500 (*)    All other components within normal limits  LIPASE, BLOOD    PROCEDURES  Procedure(s) performed (including Critical Care):  Procedures   ____________________________________________   INITIAL IMPRESSION / ASSESSMENT AND PLAN / ED COURSE      70 year old female with past medical history of hypertension and arthritis who presents to the ED complaining of constant abdominal pain in her bilateral lower quadrants, right greater than left, since this morning.  She does have tenderness on exam and we will further assess with CT scan.  Labs thus far are reassuring, LFTs and lipase within normal limits.  UA shows no signs of infection.  Patient declines pain medication at  this time and we will reassess following CT results.  CT abdomen/pelvis is negative for acute process, does show significant stool burden which could be contributing to patient's pain.  Patient is appropriate for discharge home with PCP follow-up, was counseled on over-the-counter medication regimen for constipation.  She was counseled to return to the ED for new or worsening symptoms, patient agrees with plan.      ____________________________________________   FINAL CLINICAL IMPRESSION(S) / ED DIAGNOSES  Final diagnoses:  Right lower quadrant abdominal pain  Constipation, unspecified constipation type     ED Discharge Orders     None        Note:  This document was prepared using Dragon voice recognition software and may include  unintentional dictation errors.    Chesley Noon, MD 11/10/20 [phone]

## 2020-11-10 NOTE — ED Triage Notes (Signed)
Pt presents via POV c/o RLQ abd pain since Friday. Reports foul smelling BM. Denies N/V/D. Reports hx appendectomy.

## 2020-12-17 DIAGNOSIS — E559 Vitamin D deficiency, unspecified: Secondary | ICD-10-CM | POA: Diagnosis not present

## 2020-12-17 DIAGNOSIS — M2550 Pain in unspecified joint: Secondary | ICD-10-CM | POA: Diagnosis not present

## 2020-12-17 DIAGNOSIS — E782 Mixed hyperlipidemia: Secondary | ICD-10-CM | POA: Diagnosis not present

## 2020-12-17 DIAGNOSIS — M150 Primary generalized (osteo)arthritis: Secondary | ICD-10-CM | POA: Diagnosis not present

## 2020-12-17 DIAGNOSIS — E118 Type 2 diabetes mellitus with unspecified complications: Secondary | ICD-10-CM | POA: Diagnosis not present

## 2020-12-17 DIAGNOSIS — I10 Essential (primary) hypertension: Secondary | ICD-10-CM | POA: Diagnosis not present

## 2020-12-17 DIAGNOSIS — E785 Hyperlipidemia, unspecified: Secondary | ICD-10-CM | POA: Diagnosis not present

## 2020-12-17 DIAGNOSIS — E119 Type 2 diabetes mellitus without complications: Secondary | ICD-10-CM | POA: Diagnosis not present

## 2020-12-18 DIAGNOSIS — E785 Hyperlipidemia, unspecified: Secondary | ICD-10-CM | POA: Diagnosis not present

## 2020-12-18 DIAGNOSIS — E119 Type 2 diabetes mellitus without complications: Secondary | ICD-10-CM | POA: Diagnosis not present

## 2020-12-18 DIAGNOSIS — E039 Hypothyroidism, unspecified: Secondary | ICD-10-CM | POA: Diagnosis not present

## 2020-12-18 DIAGNOSIS — I10 Essential (primary) hypertension: Secondary | ICD-10-CM | POA: Diagnosis not present

## 2020-12-18 DIAGNOSIS — E559 Vitamin D deficiency, unspecified: Secondary | ICD-10-CM | POA: Diagnosis not present

## 2021-01-12 ENCOUNTER — Emergency Department: Payer: Medicare HMO

## 2021-01-12 ENCOUNTER — Other Ambulatory Visit: Payer: Self-pay

## 2021-01-12 ENCOUNTER — Encounter: Payer: Self-pay | Admitting: Intensive Care

## 2021-01-12 ENCOUNTER — Emergency Department
Admission: EM | Admit: 2021-01-12 | Discharge: 2021-01-13 | Disposition: A | Discharge status: Home / Self Care | Payer: Medicare HMO | Attending: Emergency Medicine | Admitting: Emergency Medicine

## 2021-01-12 DIAGNOSIS — Z7982 Long term (current) use of aspirin: Secondary | ICD-10-CM | POA: Insufficient documentation

## 2021-01-12 DIAGNOSIS — H9201 Otalgia, right ear: Secondary | ICD-10-CM | POA: Diagnosis not present

## 2021-01-12 DIAGNOSIS — I10 Essential (primary) hypertension: Secondary | ICD-10-CM | POA: Diagnosis not present

## 2021-01-12 DIAGNOSIS — R202 Paresthesia of skin: Secondary | ICD-10-CM | POA: Diagnosis not present

## 2021-01-12 DIAGNOSIS — R200 Anesthesia of skin: Secondary | ICD-10-CM

## 2021-01-12 DIAGNOSIS — Z79899 Other long term (current) drug therapy: Secondary | ICD-10-CM | POA: Insufficient documentation

## 2021-01-12 DIAGNOSIS — R42 Dizziness and giddiness: Secondary | ICD-10-CM

## 2021-01-12 DIAGNOSIS — R519 Headache, unspecified: Secondary | ICD-10-CM | POA: Insufficient documentation

## 2021-01-12 LAB — CBC
HCT: 37.6 % (ref 36.0–46.0)
Hemoglobin: 12.6 g/dL (ref 12.0–15.0)
MCH: 32.4 pg (ref 26.0–34.0)
MCHC: 33.5 g/dL (ref 30.0–36.0)
MCV: 96.7 fL (ref 80.0–100.0)
Platelets: 234 10*3/uL (ref 150–400)
RBC: 3.89 MIL/uL (ref 3.87–5.11)
RDW: 14.2 % (ref 11.5–15.5)
WBC: 6.2 10*3/uL (ref 4.0–10.5)
nRBC: 0 % (ref 0.0–0.2)

## 2021-01-12 LAB — URINALYSIS, COMPLETE (UACMP) WITH MICROSCOPIC
Bacteria, UA: NONE SEEN
Bilirubin Urine: NEGATIVE
Glucose, UA: >=500 mg/dL — AB
Hgb urine dipstick: NEGATIVE
Ketones, ur: NEGATIVE mg/dL
Leukocytes,Ua: NEGATIVE
Nitrite: NEGATIVE
Protein, ur: NEGATIVE mg/dL
Specific Gravity, Urine: 1.007 (ref 1.005–1.030)
Squamous Epithelial / HPF: NONE SEEN (ref 0–5)
pH: 7 (ref 5.0–8.0)

## 2021-01-12 LAB — TROPONIN I (HIGH SENSITIVITY)
Troponin I (High Sensitivity): 5 ng/L (ref <18)
Troponin I (High Sensitivity): 5 ng/L (ref <18)

## 2021-01-12 LAB — BASIC METABOLIC PANEL
Anion gap: 7 (ref 5–15)
BUN: 22 mg/dL (ref 8–23)
CO2: 26 mmol/L (ref 22–32)
Calcium: 9.4 mg/dL (ref 8.9–10.3)
Chloride: 101 mmol/L (ref 98–111)
Creatinine, Ser: 1.35 mg/dL — ABNORMAL HIGH (ref 0.44–1.00)
GFR, Estimated: 43 mL/min — ABNORMAL LOW (ref >60)
Glucose, Bld: 121 mg/dL — ABNORMAL HIGH (ref 70–99)
Potassium: 4.5 mmol/L (ref 3.5–5.1)
Sodium: 134 mmol/L — ABNORMAL LOW (ref 135–145)

## 2021-01-12 MED ORDER — MECLIZINE HCL 25 MG PO TABS
25.0000 mg | ORAL_TABLET | Freq: Once | ORAL | Status: AC
  Administered 2021-01-12: 25 mg via ORAL
  Filled 2021-01-12: qty 1

## 2021-01-12 MED ORDER — ACETAMINOPHEN 500 MG PO TABS
1000.0000 mg | ORAL_TABLET | Freq: Once | ORAL | Status: AC
  Administered 2021-01-12: 1000 mg via ORAL
  Filled 2021-01-12: qty 2

## 2021-01-12 MED ORDER — ACETAMINOPHEN 500 MG PO TABS
1000.0000 mg | ORAL_TABLET | Freq: Once | ORAL | Status: DC
  Filled 2021-01-12: qty 2

## 2021-01-12 NOTE — ED Notes (Signed)
Patient stated "it feels like something is crawling around in my head."

## 2021-01-12 NOTE — ED Triage Notes (Addendum)
Patient c/o dizziness and right ear discomfort since Sunday. Patient reports upper back pain that radiated through to left breast once yesterday

## 2021-01-12 NOTE — ED Provider Notes (Signed)
[facility] Emergency Department Provider Note  ____________________________________________   Event Date/Time   First MD Initiated Contact with Patient 01/12/21 2259     (approximate)  I have reviewed the triage vital signs and the nursing notes.   HISTORY  Chief Complaint Dizziness and Ear Pain    HPI MILTON STREICHER is a 70 y.o. female with h/o HTN, HLD, obesity who presents to the ED today with vertigo that has been waxing and waning since Sunday 01/10/21.  States she also had R sided facial, arm and torso numbness that day but that has since resolved.  Has had some right ear pain.  No tinnitus or hearing loss.  Has had right sided headache that has been intermittent as well.  No head injury. Not on blood thinners.  No numbness or weakness now.  No chest pain or SOB. No N/V/D.  No fever.  No h/o previous stroke.          Past Medical History:  Diagnosis Date   Arthritis    Hypertension    Musculoskeletal chest pain     There are no problems to display for this patient.   Past Surgical History:  Procedure Laterality Date   APPENDECTOMY     CARDIAC SURGERY     ablation   REPLACEMENT TOTAL KNEE      Prior to Admission medications   Medication Sig Start Date End Date Taking? Authorizing Provider  meclizine (ANTIVERT) 25 MG tablet Take 1 tablet (25 mg total) by mouth 3 (three) times daily as needed for dizziness. 01/13/21  Yes Tre Sanker, Layla Maw, DO  aspirin 81 MG tablet Take 81 mg by mouth daily.    [provider]  atorvastatin (LIPITOR) 40 MG tablet Take 40 mg by mouth daily. 04/15/15   [provider]  dicyclomine (BENTYL) 20 MG tablet Take 1 tablet (20 mg total) by mouth every 6 (six) hours as needed. 06/05/18   Irean Hong, MD  docusate sodium (COLACE) 100 MG capsule Take 100 mg by mouth 2 (two) times daily.    [provider]  lactulose (CHRONULAC) 10 GM/15ML solution Take 30 mLs (20 g total) by mouth daily as needed  for mild constipation. 06/05/18   Irean Hong, MD  lisinopril (PRINIVIL,ZESTRIL) 10 MG tablet Take 10 mg by mouth daily.    [provider]  metoprolol tartrate (LOPRESSOR) 25 MG tablet Take 25 mg by mouth 2 (two) times daily.    [provider]  omeprazole (PRILOSEC) 20 MG capsule Take 20 mg by mouth daily.    [provider]  ondansetron (ZOFRAN ODT) 4 MG disintegrating tablet Take 1 tablet (4 mg total) by mouth every 8 (eight) hours as needed for nausea or vomiting. 06/05/18   Irean Hong, MD    Allergies Penicillin g  Family History  Problem Relation Age of Onset   Breast cancer Cousin        paternal    Social History Social History   Tobacco Use   Smoking status: Never   Smokeless tobacco: Never  Substance Use Topics   Alcohol use: Not Currently   Drug use: Never    Review of Systems Constitutional: No fever. Eyes: No visual changes. ENT: No sore throat. Cardiovascular: Denies chest pain. Respiratory: Denies shortness of breath. Gastrointestinal: No nausea, vomiting, diarrhea. Genitourinary: Negative for dysuria. Musculoskeletal: Negative for back pain. Skin: Negative for rash. Neurological: Negative for focal weakness or numbness.  ____________________________________________  PHYSICAL EXAM:  VITAL SIGNS: ED Triage Vitals  Enc Vitals Group     BP 01/12/21 1857 (!) 120/102     Pulse Rate 01/12/21 1857 83     Resp 01/12/21 1857 16     Temp 01/12/21 1857 98.8 F (37.1 C)     Temp Source 01/12/21 1857 Oral     SpO2 01/12/21 1857 100 %     Weight 01/12/21 1850 230 lb (104.3 kg)     Height 01/12/21 1850 5\' 7"  (1.702 m)     Head Circumference --      Peak Flow --      Pain Score 01/12/21 1849 0     Pain Loc --      Pain Edu? --      Excl. in GC? --    CONSTITUTIONAL: Alert and oriented and responds appropriately to questions. Well-appearing; well-nourished HEAD: Normocephalic, atraumatic EYES: Conjunctivae clear, pupils appear  equal, EOM appear intact ENT: normal nose; moist mucous membranes; No pharyngeal erythema or petechiae, no tonsillar hypertrophy or exudate, no uvular deviation, no unilateral swelling, no trismus or drooling, no muffled voice, normal phonation, no stridor, multiple missing teeth, multiple dental caries present, no drainable dental abscess noted, no Ludwig's angina, tongue sits flat in the bottom of the mouth, no angioedema, no facial erythema or warmth, no facial swelling; no pain with movement of the neck, no cervical LAD.  TMs are clear bilaterally without erythema, purulence, bulging, perforation, effusion.  No cerumen impaction or sign of foreign body in the external auditory canal. No inflammation, erythema or drainage from the external auditory canal. No signs of mastoiditis. No pain with manipulation of the pinna bilaterally.  NECK: Supple, normal ROM CARD: RRR; S1 and S2 appreciated; no murmurs, no clicks, no rubs, no gallops RESP: Normal chest excursion without splinting or tachypnea; breath sounds clear and equal bilaterally; no wheezes, no rhonchi, no rales, no hypoxia or respiratory distress, speaking full sentences ABD/GI: Normal bowel sounds; non-distended; soft, non-tender, no rebound, no guarding, no peritoneal signs, no hepatosplenomegaly BACK: The back appears normal EXT: Normal ROM in all joints; no deformity noted, no edema; no cyanosis SKIN: Normal color for age and race; warm; no rash on exposed skin NEURO: Moves all extremities equally, strength 5/5 in all 4 extremities, normal sensation diffusely, normal speech, CN 2 -12 intact, no dysmetria to finger to nose, no nystagmus PSYCH: The patient's mood and manner are appropriate.  ____________________________________________   LABS (all labs ordered are listed, but only abnormal results are displayed)  Labs Reviewed  BASIC METABOLIC PANEL - Abnormal; Notable for the following components:      Result Value   Sodium 134 (*)     Glucose, Bld 121 (*)    Creatinine, Ser 1.35 (*)    GFR, Estimated 43 (*)    All other components within normal limits  URINALYSIS, COMPLETE (UACMP) WITH MICROSCOPIC - Abnormal; Notable for the following components:   Color, Urine COLORLESS (*)    APPearance CLEAR (*)    Glucose, UA >=500 (*)    All other components within normal limits  CBC  TROPONIN I (HIGH SENSITIVITY)  TROPONIN I (HIGH SENSITIVITY)   ____________________________________________  EKG   EKG Interpretation  Date/Time:  Tuesday January 12 2021 18:52:01 EDT Ventricular Rate:  82 PR Interval:  160 QRS Duration: 76 QT Interval:  362 QTC Calculation: 422 R Axis:   14 Text Interpretation: Normal sinus rhythm Cannot rule out Anterior infarct , age undetermined Abnormal  ECG Confirmed by Rochele RaringWard, Maris Abascal [phone]([id number]) on 01/12/2021 11:08:47 PM        ____________________________________________  RADIOLOGY Normajean BaxterI, Lanayah Gartley, personally viewed and evaluated these images (plain radiographs) as part of my medical decision making, as well as reviewing the written report by the radiologist.  ED MD interpretation: Chest x-ray clear.  MRI brain shows no acute abnormality.  Official radiology report(s): DG Chest 2 View  Result Date: 01/12/2021 CLINICAL DATA:  Upper back pain and dizziness, initial encounter EXAM: CHEST - 2 VIEW COMPARISON:  02/18/2019 FINDINGS: The heart size and mediastinal contours are within normal limits. Both lungs are clear. The visualized skeletal structures are unremarkable. IMPRESSION: No active cardiopulmonary disease. Electronically Signed   By: Alcide CleverMark  Lukens M.D.   On: 01/12/2021 19:12   MR BRAIN WO CONTRAST  Result Date: 01/13/2021 CLINICAL DATA:  Dizziness EXAM: MRI HEAD WITHOUT CONTRAST TECHNIQUE: Multiplanar, multiecho pulse sequences of the brain and surrounding structures were obtained without intravenous contrast. COMPARISON:  02/03/2019 FINDINGS: Brain: No acute infarct, mass effect or extra-axial  collection. Focus of chronic microhemorrhage in the right cerebellum. There is multifocal hyperintense T2-weighted signal within the white matter. Parenchymal volume and CSF spaces are normal. The midline structures are normal. Vascular: Major flow voids are preserved. Skull and upper cervical spine: Normal calvarium and skull base. Visualized upper cervical spine and soft tissues are normal. Sinuses/Orbits:No paranasal sinus fluid levels or advanced mucosal thickening. No mastoid or middle ear effusion. Normal orbits. IMPRESSION: 1. No acute intracranial abnormality. 2. Findings of chronic small vessel ischemia. Electronically Signed   By: Deatra RobinsonKevin  Herman M.D.   On: 01/13/2021 01:16    ____________________________________________   PROCEDURES  Procedure(s) performed (including Critical Care):  Procedures    ____________________________________________   INITIAL IMPRESSION / ASSESSMENT AND PLAN / ED COURSE  As part of my medical decision making, I reviewed the following data within the electronic MEDICAL RECORD NUMBER Nursing notes reviewed and incorporated, Labs reviewed , EKG interpreted , Old EKG reviewed, Radiograph reviewed , MRI reviewed, and Notes from prior ED visits         Patient with R sided numbness now resolved, vertigo and HA. Differential includes CVA, peripheral vertigo, ICH.  Doubt meningitis, CVT.  Labs from triage reassuring with normal Hb, electrolytes. Troponin negative.  EKG shows no arrhythmia or ischemia.  Will obtain urine and MRI brain.  Will give meclizine and tylenol.    ED PROGRESS  Patient's urine shows no sign of infection or dehydration.  MRI brain shows no acute abnormality.  Patient able to ambulate here without difficulty with steady gait.  I feel she is safe to be discharged home.  Will provide prescription of meclizine.  She has outpatient follow-up.  At this time, I do not feel there is any life-threatening condition present. I have reviewed, interpreted  and discussed all results (EKG, imaging, lab, urine as appropriate) and exam findings with patient/family. I have reviewed nursing notes and appropriate previous records.  I feel the patient is safe to be discharged home without further emergent workup and can continue workup as an outpatient as needed. Discussed usual and customary return precautions. Patient/family verbalize understanding and are comfortable with this plan.  Outpatient follow-up has been provided as needed. All questions have been answered.  ____________________________________________   FINAL CLINICAL IMPRESSION(S) / ED DIAGNOSES  Final diagnoses:  Vertigo  Right sided numbness     ED Discharge Orders          Ordered  meclizine (ANTIVERT) 25 MG tablet  3 times daily PRN        01/13/21 0121            *Please note:  CHANI GHANEM was evaluated in Emergency Department on 01/13/2021 for the symptoms described in the history of present illness. She was evaluated in the context of the global COVID-19 pandemic, which necessitated consideration that the patient might be at risk for infection with the SARS-CoV-2 virus that causes COVID-19. Institutional protocols and algorithms that pertain to the evaluation of patients at risk for COVID-19 are in a state of rapid change based on information released by regulatory bodies including the CDC and federal and state organizations. These policies and algorithms were followed during the patient's care in the ED.  Some ED evaluations and interventions may be delayed as a result of limited staffing during and the pandemic.*   Note:  This document was prepared using Dragon voice recognition software and may include unintentional dictation errors.    Kiowa Peifer, Layla Maw, DO 01/13/21 0121

## 2021-01-13 ENCOUNTER — Emergency Department: Payer: Medicare HMO

## 2021-01-13 DIAGNOSIS — H8110 Benign paroxysmal vertigo, unspecified ear: Secondary | ICD-10-CM | POA: Diagnosis not present

## 2021-01-13 DIAGNOSIS — E118 Type 2 diabetes mellitus with unspecified complications: Secondary | ICD-10-CM | POA: Diagnosis not present

## 2021-01-13 DIAGNOSIS — I10 Essential (primary) hypertension: Secondary | ICD-10-CM | POA: Diagnosis not present

## 2021-01-13 DIAGNOSIS — R42 Dizziness and giddiness: Secondary | ICD-10-CM | POA: Diagnosis not present

## 2021-01-13 DIAGNOSIS — E782 Mixed hyperlipidemia: Secondary | ICD-10-CM | POA: Diagnosis not present

## 2021-01-13 MED ORDER — MECLIZINE HCL 25 MG PO TABS: 25.0000 mg | ORAL_TABLET | Freq: Three times a day (TID) | ORAL | 0 refills | Status: DC | PRN

## 2021-01-13 NOTE — ED Notes (Signed)
Patient transported to MRI 

## 2021-01-13 NOTE — Discharge Instructions (Addendum)
You may take Tylenol 1000 mg every 6 hours as needed for pain.  Your labs, urine, EKG and MRI brain were normal today.  I recommend close follow-up with your primary care provider if symptoms continue.  We will discharge you home with a medication called meclizine that can help with dizziness.  Your symptoms are likely coming from your inner ear which is why you are having dizziness and ear pain.  You do not have any sign of ear infection and do not need antibiotics.  Your MRI did not show any sign of a stroke today.

## 2021-02-05 DIAGNOSIS — E785 Hyperlipidemia, unspecified: Secondary | ICD-10-CM | POA: Diagnosis not present

## 2021-02-05 DIAGNOSIS — E669 Obesity, unspecified: Secondary | ICD-10-CM | POA: Diagnosis not present

## 2021-02-05 DIAGNOSIS — I10 Essential (primary) hypertension: Secondary | ICD-10-CM | POA: Diagnosis not present

## 2021-02-05 DIAGNOSIS — M170 Bilateral primary osteoarthritis of knee: Secondary | ICD-10-CM | POA: Diagnosis not present

## 2021-02-05 DIAGNOSIS — H8110 Benign paroxysmal vertigo, unspecified ear: Secondary | ICD-10-CM | POA: Diagnosis not present

## 2021-02-05 DIAGNOSIS — E782 Mixed hyperlipidemia: Secondary | ICD-10-CM | POA: Diagnosis not present

## 2021-02-05 DIAGNOSIS — M2550 Pain in unspecified joint: Secondary | ICD-10-CM | POA: Diagnosis not present

## 2021-02-05 DIAGNOSIS — Z23 Encounter for immunization: Secondary | ICD-10-CM | POA: Diagnosis not present

## 2021-02-05 DIAGNOSIS — E118 Type 2 diabetes mellitus with unspecified complications: Secondary | ICD-10-CM | POA: Diagnosis not present

## 2021-02-08 DIAGNOSIS — E782 Mixed hyperlipidemia: Secondary | ICD-10-CM | POA: Diagnosis not present

## 2021-02-08 DIAGNOSIS — E785 Hyperlipidemia, unspecified: Secondary | ICD-10-CM | POA: Diagnosis not present

## 2021-02-08 DIAGNOSIS — Z23 Encounter for immunization: Secondary | ICD-10-CM | POA: Diagnosis not present

## 2021-02-08 DIAGNOSIS — E118 Type 2 diabetes mellitus with unspecified complications: Secondary | ICD-10-CM | POA: Diagnosis not present

## 2021-02-08 DIAGNOSIS — E669 Obesity, unspecified: Secondary | ICD-10-CM | POA: Diagnosis not present

## 2021-02-08 DIAGNOSIS — I10 Essential (primary) hypertension: Secondary | ICD-10-CM | POA: Diagnosis not present

## 2021-02-08 DIAGNOSIS — H8110 Benign paroxysmal vertigo, unspecified ear: Secondary | ICD-10-CM | POA: Diagnosis not present

## 2021-02-08 DIAGNOSIS — M170 Bilateral primary osteoarthritis of knee: Secondary | ICD-10-CM | POA: Diagnosis not present

## 2021-02-08 DIAGNOSIS — M2550 Pain in unspecified joint: Secondary | ICD-10-CM | POA: Diagnosis not present

## 2021-05-18 DIAGNOSIS — M2041 Other hammer toe(s) (acquired), right foot: Secondary | ICD-10-CM | POA: Diagnosis not present

## 2021-05-18 DIAGNOSIS — M79675 Pain in left toe(s): Secondary | ICD-10-CM | POA: Diagnosis not present

## 2021-05-18 DIAGNOSIS — L600 Ingrowing nail: Secondary | ICD-10-CM | POA: Diagnosis not present

## 2021-05-18 DIAGNOSIS — M79674 Pain in right toe(s): Secondary | ICD-10-CM | POA: Diagnosis not present

## 2021-05-18 DIAGNOSIS — M2042 Other hammer toe(s) (acquired), left foot: Secondary | ICD-10-CM | POA: Diagnosis not present

## 2021-05-18 DIAGNOSIS — M898X9 Other specified disorders of bone, unspecified site: Secondary | ICD-10-CM | POA: Diagnosis not present

## 2021-05-18 DIAGNOSIS — B351 Tinea unguium: Secondary | ICD-10-CM | POA: Diagnosis not present

## 2021-06-23 DIAGNOSIS — H6992 Unspecified Eustachian tube disorder, left ear: Secondary | ICD-10-CM | POA: Diagnosis not present

## 2021-06-23 DIAGNOSIS — J309 Allergic rhinitis, unspecified: Secondary | ICD-10-CM | POA: Diagnosis not present

## 2021-06-23 DIAGNOSIS — M170 Bilateral primary osteoarthritis of knee: Secondary | ICD-10-CM | POA: Diagnosis not present

## 2021-06-23 DIAGNOSIS — H8110 Benign paroxysmal vertigo, unspecified ear: Secondary | ICD-10-CM | POA: Diagnosis not present

## 2021-06-23 DIAGNOSIS — H65112 Acute and subacute allergic otitis media (mucoid) (sanguinous) (serous), left ear: Secondary | ICD-10-CM | POA: Diagnosis not present

## 2021-06-23 DIAGNOSIS — H6980 Other specified disorders of Eustachian tube, unspecified ear: Secondary | ICD-10-CM | POA: Diagnosis not present

## 2021-06-23 DIAGNOSIS — M5441 Lumbago with sciatica, right side: Secondary | ICD-10-CM | POA: Diagnosis not present

## 2021-07-28 DIAGNOSIS — M898X9 Other specified disorders of bone, unspecified site: Secondary | ICD-10-CM | POA: Diagnosis not present

## 2021-07-28 DIAGNOSIS — M2042 Other hammer toe(s) (acquired), left foot: Secondary | ICD-10-CM | POA: Diagnosis not present

## 2021-07-28 DIAGNOSIS — M2041 Other hammer toe(s) (acquired), right foot: Secondary | ICD-10-CM | POA: Diagnosis not present

## 2021-10-12 DIAGNOSIS — M79675 Pain in left toe(s): Secondary | ICD-10-CM | POA: Diagnosis not present

## 2021-10-12 DIAGNOSIS — M79674 Pain in right toe(s): Secondary | ICD-10-CM | POA: Diagnosis not present

## 2021-10-12 DIAGNOSIS — B351 Tinea unguium: Secondary | ICD-10-CM | POA: Diagnosis not present

## 2021-10-26 DIAGNOSIS — R42 Dizziness and giddiness: Secondary | ICD-10-CM | POA: Diagnosis not present

## 2021-10-27 DIAGNOSIS — E785 Hyperlipidemia, unspecified: Secondary | ICD-10-CM | POA: Diagnosis not present

## 2021-10-27 DIAGNOSIS — E782 Mixed hyperlipidemia: Secondary | ICD-10-CM | POA: Diagnosis not present

## 2021-10-27 DIAGNOSIS — M2550 Pain in unspecified joint: Secondary | ICD-10-CM | POA: Diagnosis not present

## 2021-10-27 DIAGNOSIS — M25511 Pain in right shoulder: Secondary | ICD-10-CM | POA: Diagnosis not present

## 2021-10-27 DIAGNOSIS — D519 Vitamin B12 deficiency anemia, unspecified: Secondary | ICD-10-CM | POA: Diagnosis not present

## 2021-10-27 DIAGNOSIS — E118 Type 2 diabetes mellitus with unspecified complications: Secondary | ICD-10-CM | POA: Diagnosis not present

## 2021-10-27 DIAGNOSIS — E559 Vitamin D deficiency, unspecified: Secondary | ICD-10-CM | POA: Diagnosis not present

## 2021-10-27 DIAGNOSIS — I10 Essential (primary) hypertension: Secondary | ICD-10-CM | POA: Diagnosis not present

## 2021-10-27 DIAGNOSIS — K219 Gastro-esophageal reflux disease without esophagitis: Secondary | ICD-10-CM | POA: Diagnosis not present

## 2021-10-27 DIAGNOSIS — M19011 Primary osteoarthritis, right shoulder: Secondary | ICD-10-CM | POA: Diagnosis not present

## 2021-11-02 ENCOUNTER — Other Ambulatory Visit: Payer: Self-pay | Admitting: Family

## 2021-11-02 DIAGNOSIS — Z1231 Encounter for screening mammogram for malignant neoplasm of breast: Secondary | ICD-10-CM

## 2021-11-09 DIAGNOSIS — R0789 Other chest pain: Secondary | ICD-10-CM | POA: Diagnosis not present

## 2021-11-09 DIAGNOSIS — L2082 Flexural eczema: Secondary | ICD-10-CM | POA: Diagnosis not present

## 2021-11-12 DIAGNOSIS — R42 Dizziness and giddiness: Secondary | ICD-10-CM | POA: Diagnosis not present

## 2021-11-16 DIAGNOSIS — R42 Dizziness and giddiness: Secondary | ICD-10-CM | POA: Diagnosis not present

## 2021-11-16 DIAGNOSIS — H903 Sensorineural hearing loss, bilateral: Secondary | ICD-10-CM | POA: Diagnosis not present

## 2021-11-16 DIAGNOSIS — H9313 Tinnitus, bilateral: Secondary | ICD-10-CM | POA: Diagnosis not present

## 2021-12-21 ENCOUNTER — Ambulatory Visit
Admission: RE | Admit: 2021-12-21 | Discharge: 2021-12-21 | Disposition: A | Discharge status: Home / Self Care | Payer: Medicare Other | Source: Ambulatory Visit | Attending: Family | Admitting: Family

## 2021-12-21 DIAGNOSIS — Z1231 Encounter for screening mammogram for malignant neoplasm of breast: Secondary | ICD-10-CM | POA: Insufficient documentation

## 2022-01-03 ENCOUNTER — Other Ambulatory Visit: Payer: Self-pay | Admitting: *Deleted

## 2022-01-03 NOTE — Patient Outreach (Signed)
  Care Coordination   01/03/2022 Name: Sandra Watts MRN: [id number] DOB: January 23, 1951   Care Coordination Outreach Attempts:  An unsuccessful telephone outreach was attempted today to offer the patient information about available care coordination services as a benefit of their health plan.   Follow Up Plan:  No further outreach attempts will be made at this time. We have been unable to contact the patient to offer or enroll patient in care coordination services  Encounter Outcome:  Pt. Refused  Care Coordination Interventions Activated:  No   Care Coordination Interventions:  No, not indicated    Blanchie Serve RN, BSN [facility] Care Management [facility] [phone] [email or web address]

## 2022-01-26 ENCOUNTER — Emergency Department: Payer: Medicare Other

## 2022-01-26 ENCOUNTER — Encounter: Payer: Self-pay | Admitting: *Deleted

## 2022-01-26 ENCOUNTER — Emergency Department
Admission: EM | Admit: 2022-01-26 | Discharge: 2022-01-27 | Disposition: A | Discharge status: Home / Self Care | Payer: Medicare Other | Attending: Emergency Medicine | Admitting: Emergency Medicine

## 2022-01-26 ENCOUNTER — Other Ambulatory Visit: Payer: Self-pay

## 2022-01-26 DIAGNOSIS — Z7982 Long term (current) use of aspirin: Secondary | ICD-10-CM | POA: Insufficient documentation

## 2022-01-26 DIAGNOSIS — R519 Headache, unspecified: Secondary | ICD-10-CM | POA: Diagnosis not present

## 2022-01-26 DIAGNOSIS — G44209 Tension-type headache, unspecified, not intractable: Secondary | ICD-10-CM | POA: Insufficient documentation

## 2022-01-26 DIAGNOSIS — Z79899 Other long term (current) drug therapy: Secondary | ICD-10-CM | POA: Diagnosis not present

## 2022-01-26 DIAGNOSIS — I10 Essential (primary) hypertension: Secondary | ICD-10-CM | POA: Insufficient documentation

## 2022-01-26 MED ORDER — ACETAMINOPHEN 500 MG PO TABS
1000.0000 mg | ORAL_TABLET | Freq: Once | ORAL | Status: AC
  Administered 2022-01-27: 1000 mg via ORAL
  Filled 2022-01-26: qty 2

## 2022-01-26 NOTE — ED Triage Notes (Signed)
Pt ambulatory to triage.  Pt has a headache since yesterday.  Pt has tightness in her head.  Pt reports pain on the right side.  Pt has dizziness   hx vertigo.  Pt alert  speech clear.

## 2022-01-26 NOTE — ED Provider Notes (Signed)
[facility] Provider Note    Event Date/Time   First MD Initiated Contact with Patient 01/26/22 2337     (approximate)   History   Headache   HPI  Sandra Watts is a 71 y.o. female with history of hypertension, vertigo who presents to the emergency department with diffuse tightness throughout her scalp today.  Denies any head injury.  Not on blood thinners.  No numbness, tingling or weakness.  No fevers, neck pain or neck stiffness.  No chest pain or shortness of breath.  She states she took meclizine at home which she thought was a headache medicine without relief.  She did have some vertigo earlier today but none currently.   History provided by patient and husband.    Past Medical History:  Diagnosis Date   Arthritis    Hypertension    Musculoskeletal chest pain     Past Surgical History:  Procedure Laterality Date   APPENDECTOMY     CARDIAC SURGERY     ablation   REPLACEMENT TOTAL KNEE      MEDICATIONS:  Prior to Admission medications   Medication Sig Start Date End Date Taking? Authorizing Provider  aspirin 81 MG tablet Take 81 mg by mouth daily.    [provider]  atorvastatin (LIPITOR) 40 MG tablet Take 40 mg by mouth daily. 04/15/15   [provider]  dicyclomine (BENTYL) 20 MG tablet Take 1 tablet (20 mg total) by mouth every 6 (six) hours as needed. 06/05/18   Irean Hong, MD  docusate sodium (COLACE) 100 MG capsule Take 100 mg by mouth 2 (two) times daily.    [provider]  lactulose (CHRONULAC) 10 GM/15ML solution Take 30 mLs (20 g total) by mouth daily as needed for mild constipation. 06/05/18   Irean Hong, MD  lisinopril (PRINIVIL,ZESTRIL) 10 MG tablet Take 10 mg by mouth daily.    [provider]  meclizine (ANTIVERT) 25 MG tablet Take 1 tablet (25 mg total) by mouth 3 (three) times daily as needed for dizziness. 01/13/21   Danean Marner, Layla Maw, DO  metoprolol tartrate (LOPRESSOR) 25 MG tablet Take  25 mg by mouth 2 (two) times daily.    [provider]  omeprazole (PRILOSEC) 20 MG capsule Take 20 mg by mouth daily.    [provider]  ondansetron (ZOFRAN ODT) 4 MG disintegrating tablet Take 1 tablet (4 mg total) by mouth every 8 (eight) hours as needed for nausea or vomiting. 06/05/18   Irean Hong, MD    Physical Exam   Triage Vital Signs: ED Triage Vitals  Enc Vitals Group     BP 01/26/22 1733 (!) 140/81     Pulse Rate 01/26/22 1733 74     Resp 01/26/22 1733 18     Temp 01/26/22 1733 98.3 F (36.8 C)     Temp Source 01/26/22 1733 Oral     SpO2 01/26/22 1733 100 %     Weight 01/26/22 1729 224 lb (101.6 kg)     Height 01/26/22 1729 5\' 8"  (1.727 m)     Head Circumference --      Peak Flow --      Pain Score 01/26/22 1729 10     Pain Loc --      Pain Edu? --      Excl. in GC? --     Most recent vital signs: Vitals:   01/26/22 1733  BP: (!) 140/81  Pulse: 74  Resp: 18  Temp: 98.3 F (36.8 C)  SpO2: 100%    CONSTITUTIONAL: Alert and oriented and responds appropriately to questions. Well-appearing; well-nourished HEAD: Normocephalic, atraumatic, nontender to palpation EYES: Conjunctivae clear, pupils appear equal, sclera nonicteric ENT: normal nose; moist mucous membranes NECK: Supple, normal ROM; no meningismus CARD: RRR; S1 and S2 appreciated; no murmurs, no clicks, no rubs, no gallops RESP: Normal chest excursion without splinting or tachypnea; breath sounds clear and equal bilaterally; no wheezes, no rhonchi, no rales, no hypoxia or respiratory distress, speaking full sentences ABD/GI: Normal bowel sounds; non-distended; soft, non-tender, no rebound, no guarding, no peritoneal signs BACK: The back appears normal EXT: Normal ROM in all joints; no deformity noted, no edema; no cyanosis SKIN: Normal color for age and race; warm; no rash on exposed skin NEURO: Moves all extremities equally, normal speech, normal sensation diffusely, cranial nerves II  through XII intact PSYCH: The patient's mood and manner are appropriate.   ED Results / Procedures / Treatments   LABS: (all labs ordered are listed, but only abnormal results are displayed) Labs Reviewed - No data to display   EKG:     RADIOLOGY: My personal review and interpretation of imaging: CT head shows no acute abnormality.  I have personally reviewed all radiology reports.   CT Head Wo Contrast  Result Date: 01/26/2022 CLINICAL DATA:  Right-sided head tightness. EXAM: CT HEAD WITHOUT CONTRAST TECHNIQUE: Contiguous axial images were obtained from the base of the skull through the vertex without intravenous contrast. RADIATION DOSE REDUCTION: This exam was performed according to the departmental dose-optimization program which includes automated exposure control, adjustment of the mA and/or kV according to patient size and/or use of iterative reconstruction technique. COMPARISON:  April 20, 2019 FINDINGS: Brain: No evidence of acute infarction, hemorrhage, hydrocephalus, extra-axial collection or mass lesion/mass effect. Vascular: No hyperdense vessel or unexpected calcification. Skull: Normal. Negative for fracture or focal lesion. Sinuses/Orbits: There is mild bilateral ethmoid sinus mucosal thickening. Other: None. IMPRESSION: 1. No acute intracranial abnormality. 2. Mild bilateral ethmoid sinus disease. Electronically Signed   By: Aram Candela M.D.   On: 01/26/2022 22:24     PROCEDURES:  Critical Care performed: No     Procedures    IMPRESSION / MDM / ASSESSMENT AND PLAN / ED COURSE  I reviewed the triage vital signs and the nursing notes.    Patient here with complaints of scalp tightness.  No focal neurologic deficits.  No fevers, meningismus.  The patient is on the cardiac monitor to evaluate for evidence of arrhythmia and/or significant heart rate changes.   DIFFERENTIAL DIAGNOSIS (includes but not limited to):   Migraine headache, sinus headache,  tension headache, scalp discomfort from wig, doubt intracranial hemorrhage, CVA, CVT, meningitis   Patient's presentation is most consistent with acute presentation with potential threat to life or bodily function.   PLAN: Head CT obtained from triage and reviewed and interpreted by myself and the radiologist and shows some sinusitis but no other acute abnormality.  She has no sinus congestion, fever, cough, sinus pressure on exam.  Her headache is mostly throughout the top of her scalp.  I suspect that her wig is on too tightly.  Once it was removed she already states she is feeling better.  Will give Tylenol and monitor.  The nurse has demonstrated how to loosen the wig.   MEDICATIONS GIVEN IN ED: Medications  acetaminophen (TYLENOL) tablet 1,000 mg (1,000 mg Oral Given 01/27/22 0013)     ED  COURSE: Patient reports feeling better.  No focal neurologic deficits, fever, meningismus.  She is comfortable with plan for discharge home.  Recommended Tylenol as needed.  I do not feel she needs antibiotics for sinusitis at this time given she is not symptomatic.  Recommended over-the-counter Flonase.   At this time, I do not feel there is any life-threatening condition present. I reviewed all nursing notes, vitals, pertinent previous records.  All lab and urine results, EKGs, imaging ordered have been independently reviewed and interpreted by myself.  I reviewed all available radiology reports from any imaging ordered this visit.  Based on my assessment, I feel the patient is safe to be discharged home without further emergent workup and can continue workup as an outpatient as needed. Discussed all findings, treatment plan as well as usual and customary return precautions.  They verbalize understanding and are comfortable with this plan.  Outpatient follow-up has been provided as needed.  All questions have been answered.    CONSULTS: Patient here with tension headache that has resolved with removal of  her wig.  Head CT unremarkable.  No admission required at this time.   OUTSIDE RECORDS REVIEWED: Reviewed patient's last podiatry note in May 2023.       FINAL CLINICAL IMPRESSION(S) / ED DIAGNOSES   Final diagnoses:  Tension headache     Rx / DC Orders   ED Discharge Orders     None        Note:  This document was prepared using Dragon voice recognition software and may include unintentional dictation errors.   [place] Moise, Layla Maw, DO 01/27/22 0025

## 2022-01-26 NOTE — ED Notes (Signed)
No blood work at this time per [employer] NP

## 2022-01-26 NOTE — ED Provider Triage Note (Signed)
Emergency Medicine Provider Triage Evaluation Note  Sandra Watts , a 71 y.o. female  was evaluated in triage.  Pt complains of headache since yesterday. She feels there is pressure in her head. Yesterday she felt a stinging feeling on the right side of her head. No known injury. No visual changes, nausea, confusion.  Physical Exam  Ht 5\' 8"  (1.727 m)   Wt 101.6 kg   BMI 34.06 kg/m  Gen:   Awake, no distress   Resp:  Normal effort  MSK:   Moves extremities without difficulty  Other:    Medical Decision Making  Medically screening exam initiated at 5:33 PM.  Appropriate orders placed.  Sandra Watts was informed that the remainder of the evaluation will be completed by another provider, this initial triage assessment does not replace that evaluation, and the importance of remaining in the ED until their evaluation is complete.  CT head ordered.   Brynda Rim, FNP 01/26/22 1934

## 2022-01-27 NOTE — Discharge Instructions (Signed)
Your head CT today showed no acute abnormality.  You did have some mild sinus congestion but given you are not having any symptoms of a sinus infection, I do not feel you need to be on antibiotics.  You may use over-the-counter Flonase daily and follow-up with your doctor if you start having congestion, facial discomfort, fever.  I believe that your headache was caused by your wig.  I recommend if you continue to have these headaches that you remove your wig and you may take Tylenol 1000 mg every 6 hours as needed for pain.  Please return to the emergency department if you have worsening or different headache, fever, neck pain or neck stiffness, numbness, tingling, weakness, changes in your vision or speech, chest pain or shortness of breath.

## 2022-02-01 DIAGNOSIS — E785 Hyperlipidemia, unspecified: Secondary | ICD-10-CM | POA: Diagnosis not present

## 2022-02-01 DIAGNOSIS — M25511 Pain in right shoulder: Secondary | ICD-10-CM | POA: Diagnosis not present

## 2022-02-01 DIAGNOSIS — Z1231 Encounter for screening mammogram for malignant neoplasm of breast: Secondary | ICD-10-CM | POA: Diagnosis not present

## 2022-02-01 DIAGNOSIS — E1169 Type 2 diabetes mellitus with other specified complication: Secondary | ICD-10-CM | POA: Diagnosis not present

## 2022-02-01 DIAGNOSIS — I10 Essential (primary) hypertension: Secondary | ICD-10-CM | POA: Diagnosis not present

## 2022-02-01 DIAGNOSIS — E559 Vitamin D deficiency, unspecified: Secondary | ICD-10-CM | POA: Diagnosis not present

## 2022-02-01 DIAGNOSIS — Z0000 Encounter for general adult medical examination without abnormal findings: Secondary | ICD-10-CM | POA: Diagnosis not present

## 2022-02-01 DIAGNOSIS — E118 Type 2 diabetes mellitus with unspecified complications: Secondary | ICD-10-CM | POA: Diagnosis not present

## 2022-02-01 DIAGNOSIS — Z0001 Encounter for general adult medical examination with abnormal findings: Secondary | ICD-10-CM | POA: Diagnosis not present

## 2022-03-09 DIAGNOSIS — M2041 Other hammer toe(s) (acquired), right foot: Secondary | ICD-10-CM | POA: Diagnosis not present

## 2022-03-09 DIAGNOSIS — B351 Tinea unguium: Secondary | ICD-10-CM | POA: Diagnosis not present

## 2022-03-09 DIAGNOSIS — L600 Ingrowing nail: Secondary | ICD-10-CM | POA: Diagnosis not present

## 2022-03-09 DIAGNOSIS — M2042 Other hammer toe(s) (acquired), left foot: Secondary | ICD-10-CM | POA: Diagnosis not present

## 2022-03-09 DIAGNOSIS — M79675 Pain in left toe(s): Secondary | ICD-10-CM | POA: Diagnosis not present

## 2022-03-09 DIAGNOSIS — M898X9 Other specified disorders of bone, unspecified site: Secondary | ICD-10-CM | POA: Diagnosis not present

## 2022-03-09 DIAGNOSIS — M79674 Pain in right toe(s): Secondary | ICD-10-CM | POA: Diagnosis not present

## 2022-06-09 ENCOUNTER — Emergency Department
Admission: EM | Admit: 2022-06-09 | Discharge: 2022-06-09 | Disposition: A | Discharge status: Home / Self Care | Payer: 59 | Attending: Emergency Medicine | Admitting: Emergency Medicine

## 2022-06-09 ENCOUNTER — Emergency Department: Payer: 59

## 2022-06-09 ENCOUNTER — Other Ambulatory Visit: Payer: Self-pay

## 2022-06-09 DIAGNOSIS — R109 Unspecified abdominal pain: Secondary | ICD-10-CM

## 2022-06-09 DIAGNOSIS — R1011 Right upper quadrant pain: Secondary | ICD-10-CM | POA: Diagnosis not present

## 2022-06-09 DIAGNOSIS — I700 Atherosclerosis of aorta: Secondary | ICD-10-CM | POA: Diagnosis not present

## 2022-06-09 DIAGNOSIS — R1031 Right lower quadrant pain: Secondary | ICD-10-CM | POA: Diagnosis not present

## 2022-06-09 LAB — COMPREHENSIVE METABOLIC PANEL
ALT: 22 U/L (ref 0–44)
AST: 28 U/L (ref 15–41)
Albumin: 3.9 g/dL (ref 3.5–5.0)
Alkaline Phosphatase: 73 U/L (ref 38–126)
Anion gap: 9 (ref 5–15)
BUN: 20 mg/dL (ref 8–23)
CO2: 25 mmol/L (ref 22–32)
Calcium: 8.8 mg/dL — ABNORMAL LOW (ref 8.9–10.3)
Chloride: 103 mmol/L (ref 98–111)
Creatinine, Ser: 1.2 mg/dL — ABNORMAL HIGH (ref 0.44–1.00)
GFR, Estimated: 49 mL/min — ABNORMAL LOW (ref >60)
Glucose, Bld: 96 mg/dL (ref 70–99)
Potassium: 4.3 mmol/L (ref 3.5–5.1)
Sodium: 137 mmol/L (ref 135–145)
Total Bilirubin: 0.7 mg/dL (ref 0.3–1.2)
Total Protein: 7.8 g/dL (ref 6.5–8.1)

## 2022-06-09 LAB — URINALYSIS, ROUTINE W REFLEX MICROSCOPIC
Bacteria, UA: NONE SEEN
Bilirubin Urine: NEGATIVE
Glucose, UA: >=500 mg/dL — AB
Hgb urine dipstick: NEGATIVE
Ketones, ur: NEGATIVE mg/dL
Leukocytes,Ua: NEGATIVE
Nitrite: NEGATIVE
Protein, ur: NEGATIVE mg/dL
Specific Gravity, Urine: 1.015 (ref 1.005–1.030)
Squamous Epithelial / HPF: NONE SEEN /HPF (ref 0–5)
pH: 7 (ref 5.0–8.0)

## 2022-06-09 LAB — CBC
HCT: 38.1 % (ref 36.0–46.0)
Hemoglobin: 12.3 g/dL (ref 12.0–15.0)
MCH: 31.5 pg (ref 26.0–34.0)
MCHC: 32.3 g/dL (ref 30.0–36.0)
MCV: 97.4 fL (ref 80.0–100.0)
Platelets: 210 10*3/uL (ref 150–400)
RBC: 3.91 MIL/uL (ref 3.87–5.11)
RDW: 14.1 % (ref 11.5–15.5)
WBC: 4.8 10*3/uL (ref 4.0–10.5)
nRBC: 0 % (ref 0.0–0.2)

## 2022-06-09 LAB — TROPONIN I (HIGH SENSITIVITY): Troponin I (High Sensitivity): 4 ng/L (ref <18)

## 2022-06-09 LAB — LIPASE, BLOOD: Lipase: 58 U/L — ABNORMAL HIGH (ref 11–51)

## 2022-06-09 MED ORDER — ALUM & MAG HYDROXIDE-SIMETH 200-200-20 MG/5ML PO SUSP
30.0000 mL | Freq: Once | ORAL | Status: AC
  Administered 2022-06-09: 30 mL via ORAL
  Filled 2022-06-09: qty 30

## 2022-06-09 MED ORDER — OMEPRAZOLE MAGNESIUM 20 MG PO TBEC: 40.0000 mg | DELAYED_RELEASE_TABLET | Freq: Every day | ORAL | 1 refills | Status: DC

## 2022-06-09 MED ORDER — IOHEXOL 300 MG/ML  SOLN
100.0000 mL | Freq: Once | INTRAMUSCULAR | Status: AC | PRN
  Administered 2022-06-09: 100 mL via INTRAVENOUS

## 2022-06-09 MED ORDER — SUCRALFATE 1 GM/10ML PO SUSP: 1.0000 g | Freq: Four times a day (QID) | ORAL | 1 refills | Status: DC

## 2022-06-09 MED ORDER — PANTOPRAZOLE SODIUM 40 MG IV SOLR: 40.0000 mg | Freq: Once | INTRAVENOUS | Status: DC

## 2022-06-09 NOTE — ED Provider Notes (Signed)
  Physical Exam  BP 129/83 (BP Location: Left Arm)   Pulse 78   Temp 98.8 F (37.1 C) (Oral)   Resp 16   SpO2 99%   Physical Exam  Procedures  Procedures  ED Course / MDM    Medical Decision Making Amount and/or Complexity of Data Reviewed Labs: ordered. Radiology: ordered.  Risk OTC drugs. Prescription drug management.   Assumed patient care from attending, Dr. Loraine Leriche.  Right upper quadrant ultrasound and CT abdomen pelvis unremarkable.  Will treat patient for gastritis with Carafate and omeprazole.  Patient was advised to follow-up with GI.  Return precautions were given to return with new or worsening symptoms.       Vallarie Mare [place], Hershal Coria 06/09/22 2117    Vanessa Harts, MD 06/10/22 [phone]

## 2022-06-09 NOTE — ED Notes (Signed)
Pt verbalizes understanding of discharge instructions. Opportunity for questioning and answers were provided. Pt discharged from ED to home with husband.    

## 2022-06-09 NOTE — ED Provider Notes (Signed)
[facility] Provider Note    Event Date/Time   First MD Initiated Contact with Patient 06/09/22 1818     (approximate)   History   Abdominal Pain   HPI  Sandra Watts is a 72 y.o. female with diabetes, hyperlipidemia who reports prior appendectomy who comes in with right-sided abdominal pain.  She reports the pains been going on for 1 week intermittent nature worse after eating.  She does report some darker colored stool but she does report taking Pepto-Bismol and iron as well.  She denies any chest pain, shortness of breath.  She reports the pain is worse after eating.   Physical Exam   Triage Vital Signs: ED Triage Vitals  Enc Vitals Group     BP 06/09/22 1656 129/83     Pulse Rate 06/09/22 1656 78     Resp 06/09/22 1656 16     Temp 06/09/22 1656 98.8 F (37.1 C)     Temp Source 06/09/22 1656 Oral     SpO2 06/09/22 1656 99 %     Weight --      Height --      Head Circumference --      Peak Flow --      Pain Score 06/09/22 1659 10     Pain Loc --      Pain Edu? --      Excl. in [place]? --     Most recent vital signs: Vitals:   06/09/22 1656  BP: 129/83  Pulse: 78  Resp: 16  Temp: 98.8 F (37.1 C)  SpO2: 99%     General: Awake, no distress.  CV:  Good peripheral perfusion.  Resp:  Normal effort.  Abd:  No distention. Slight right sided tenderness no rebound or guarding.  Other:  Heme occult negative    ED Results / Procedures / Treatments   Labs (all labs ordered are listed, but only abnormal results are displayed) Labs Reviewed  LIPASE, BLOOD - Abnormal; Notable for the following components:      Result Value   Lipase 58 (*)    All other components within normal limits  COMPREHENSIVE METABOLIC PANEL - Abnormal; Notable for the following components:   Creatinine, Ser 1.20 (*)    Calcium 8.8 (*)    GFR, Estimated 49 (*)    All other components within normal limits  URINALYSIS, ROUTINE W REFLEX MICROSCOPIC - Abnormal;  Notable for the following components:   Color, Urine STRAW (*)    APPearance CLEAR (*)    Glucose, UA >=500 (*)    All other components within normal limits  CBC  TROPONIN I (HIGH SENSITIVITY)     EKG  My interpretation of EKG:  Pending   RADIOLOGY Pending    PROCEDURES:  Critical Care performed: No  Procedures   MEDICATIONS ORDERED IN ED: Medications  pantoprazole (PROTONIX) injection 40 mg (has no administration in time range)  alum & mag hydroxide-simeth (MAALOX/MYLANTA) 200-200-20 MG/5ML suspension 30 mL (has no administration in time range)     IMPRESSION / MDM / ASSESSMENT AND PLAN / ED COURSE  I reviewed the triage vital signs and the nursing notes.   Patient's presentation is most consistent with acute presentation with potential threat to life or bodily function.   Differential includes gastritis, ulcer, perforation, constipation.  Rectal exam with no stool in the vault there was slightly dark in stool but it was Hemoccult negative.  Suspect the discoloration is from the Pepto-Bismol  and iron.  I will add on EKG, cardiac markers to make sure no signs of ACS although this is been ongoing for about a week now.  She looks pretty well on examination.  Labs are ordered to evaluate for Electra abnormalities, dehydration, UTI.  CT scan ordered to evaluate for perforation or other acute pathology as well as an ultrasound to evaluate her gallbladder.  UA negative with gluocse in urine but pt is diabetic.  Trop negative CBC normal  CMP cr at baseline Lipase slightly elevated    Patient handed off to oncoming team pending these results     FINAL CLINICAL IMPRESSION(S) / ED DIAGNOSES   Final diagnoses:  Abdominal pain, unspecified abdominal location     Rx / DC Orders   ED Discharge Orders     None        Note:  This document was prepared using Dragon voice recognition software and may include unintentional dictation errors.   Vanessa [place],  MD 06/09/22 1946

## 2022-06-09 NOTE — ED Triage Notes (Signed)
Pt to ED via POV from home. Pt reports RLQ pain that has been ongoing x1 wk. Pt reports pain is worse after she eats. Pt also reports rectal pain. Pt has had appendectomy.

## 2022-06-14 DIAGNOSIS — K2900 Acute gastritis without bleeding: Secondary | ICD-10-CM | POA: Diagnosis not present

## 2022-06-14 DIAGNOSIS — Z1211 Encounter for screening for malignant neoplasm of colon: Secondary | ICD-10-CM | POA: Diagnosis not present

## 2022-06-14 DIAGNOSIS — R109 Unspecified abdominal pain: Secondary | ICD-10-CM | POA: Diagnosis not present

## 2022-07-05 ENCOUNTER — Other Ambulatory Visit: Payer: Self-pay | Admitting: Family

## 2022-07-12 DIAGNOSIS — B351 Tinea unguium: Secondary | ICD-10-CM | POA: Diagnosis not present

## 2022-07-12 DIAGNOSIS — L600 Ingrowing nail: Secondary | ICD-10-CM | POA: Diagnosis not present

## 2022-07-12 DIAGNOSIS — M79674 Pain in right toe(s): Secondary | ICD-10-CM | POA: Diagnosis not present

## 2022-07-12 DIAGNOSIS — M2041 Other hammer toe(s) (acquired), right foot: Secondary | ICD-10-CM | POA: Diagnosis not present

## 2022-07-12 DIAGNOSIS — M898X9 Other specified disorders of bone, unspecified site: Secondary | ICD-10-CM | POA: Diagnosis not present

## 2022-07-12 DIAGNOSIS — M79675 Pain in left toe(s): Secondary | ICD-10-CM | POA: Diagnosis not present

## 2022-07-12 DIAGNOSIS — M2042 Other hammer toe(s) (acquired), left foot: Secondary | ICD-10-CM | POA: Diagnosis not present

## 2022-07-22 ENCOUNTER — Telehealth: Payer: Self-pay

## 2022-07-22 MED ORDER — ATORVASTATIN CALCIUM 40 MG PO TABS: 40.0000 mg | ORAL_TABLET | Freq: Every day | ORAL | 1 refills | Status: DC

## 2022-07-22 MED ORDER — FLUTICASONE PROPIONATE 50 MCG/ACT NA SUSP: 1.0000 | Freq: Two times a day (BID) | NASAL | 1 refills | Status: DC

## 2022-07-22 MED ORDER — LISINOPRIL 10 MG PO TABS: 10.0000 mg | ORAL_TABLET | Freq: Every day | ORAL | 1 refills | Status: DC

## 2022-07-22 MED ORDER — MECLIZINE HCL 25 MG PO TABS: 25.0000 mg | ORAL_TABLET | Freq: Three times a day (TID) | ORAL | 3 refills | Status: DC | PRN

## 2022-07-22 MED ORDER — CETIRIZINE HCL 10 MG PO TABS: 10.0000 mg | ORAL_TABLET | Freq: Every day | ORAL | 1 refills | Status: DC

## 2022-07-22 MED ORDER — METOPROLOL TARTRATE 25 MG PO TABS: 25.0000 mg | ORAL_TABLET | Freq: Two times a day (BID) | ORAL | 1 refills | Status: DC

## 2022-07-22 MED ORDER — CYCLOBENZAPRINE HCL 5 MG PO TABS: 5.0000 mg | ORAL_TABLET | Freq: Two times a day (BID) | ORAL | 1 refills | Status: DC | PRN

## 2022-07-22 MED ORDER — OMEPRAZOLE MAGNESIUM 20 MG PO TBEC: 40.0000 mg | DELAYED_RELEASE_TABLET | Freq: Every day | ORAL | 1 refills | Status: DC

## 2022-07-22 MED ORDER — FARXIGA 10 MG PO TABS: 10.0000 mg | ORAL_TABLET | Freq: Every day | ORAL | 1 refills | Status: DC

## 2022-07-22 NOTE — Addendum Note (Signed)
Addended by: Georgian Co on: 07/22/2022 04:28 PM   Modules accepted: Orders

## 2022-07-22 NOTE — Telephone Encounter (Signed)
Pt called and left vm regarding if we can send refills on all of her rx's today before the weekend... please advise

## 2022-08-09 DIAGNOSIS — M898X9 Other specified disorders of bone, unspecified site: Secondary | ICD-10-CM | POA: Diagnosis not present

## 2022-08-09 DIAGNOSIS — M2041 Other hammer toe(s) (acquired), right foot: Secondary | ICD-10-CM | POA: Diagnosis not present

## 2022-08-09 DIAGNOSIS — M2042 Other hammer toe(s) (acquired), left foot: Secondary | ICD-10-CM | POA: Diagnosis not present

## 2022-11-15 ENCOUNTER — Ambulatory Visit (INDEPENDENT_AMBULATORY_CARE_PROVIDER_SITE_OTHER): Payer: 59 | Admitting: Family

## 2022-11-15 ENCOUNTER — Encounter: Payer: Self-pay | Admitting: Family

## 2022-11-15 VITALS — BP 122/70 | HR 70 | Ht 68.0 in | Wt 218.0 lb

## 2022-11-15 DIAGNOSIS — R5383 Other fatigue: Secondary | ICD-10-CM

## 2022-11-15 DIAGNOSIS — Z131 Encounter for screening for diabetes mellitus: Secondary | ICD-10-CM | POA: Diagnosis not present

## 2022-11-15 DIAGNOSIS — I10 Essential (primary) hypertension: Secondary | ICD-10-CM | POA: Diagnosis not present

## 2022-11-15 DIAGNOSIS — Z1231 Encounter for screening mammogram for malignant neoplasm of breast: Secondary | ICD-10-CM

## 2022-11-15 DIAGNOSIS — Z1211 Encounter for screening for malignant neoplasm of colon: Secondary | ICD-10-CM | POA: Diagnosis not present

## 2022-11-15 DIAGNOSIS — E1165 Type 2 diabetes mellitus with hyperglycemia: Secondary | ICD-10-CM

## 2022-11-15 DIAGNOSIS — E559 Vitamin D deficiency, unspecified: Secondary | ICD-10-CM

## 2022-11-15 DIAGNOSIS — E782 Mixed hyperlipidemia: Secondary | ICD-10-CM | POA: Diagnosis not present

## 2022-11-15 DIAGNOSIS — E538 Deficiency of other specified B group vitamins: Secondary | ICD-10-CM | POA: Diagnosis not present

## 2022-11-15 DIAGNOSIS — R7303 Prediabetes: Secondary | ICD-10-CM | POA: Diagnosis not present

## 2022-11-15 DIAGNOSIS — I480 Paroxysmal atrial fibrillation: Secondary | ICD-10-CM

## 2022-11-15 LAB — POC CREATINE & ALBUMIN,URINE
Creatinine, POC: 50 mg/dL
Microalbumin Ur, POC: 10 mg/L

## 2022-11-15 LAB — POCT URINALYSIS DIPSTICK
Bilirubin, UA: NEGATIVE
Blood, UA: NEGATIVE
Glucose, UA: POSITIVE — AB
Ketones, UA: NEGATIVE
Leukocytes, UA: NEGATIVE
Nitrite, UA: NEGATIVE
Protein, UA: NEGATIVE
Spec Grav, UA: 1.015 (ref 1.010–1.025)
Urobilinogen, UA: 0.2 E.U./dL
pH, UA: 7 (ref 5.0–8.0)

## 2022-11-16 LAB — CMP14+EGFR
ALT: 21 IU/L (ref 0–32)
AST: 23 IU/L (ref 0–40)
Albumin: 4 g/dL (ref 3.9–4.9)
Alkaline Phosphatase: 112 IU/L (ref 44–121)
BUN/Creatinine Ratio: 17 (ref 12–28)
BUN: 26 mg/dL (ref 8–27)
Bilirubin Total: 0.6 mg/dL (ref 0.0–1.2)
CO2: 24 mmol/L (ref 20–29)
Calcium: 9.8 mg/dL (ref 8.7–10.3)
Chloride: 102 mmol/L (ref 96–106)
Creatinine, Ser: 1.49 mg/dL — ABNORMAL HIGH (ref 0.57–1.00)
Globulin, Total: 3.3 g/dL (ref 1.5–4.5)
Glucose: 86 mg/dL (ref 70–99)
Potassium: 5.1 mmol/L (ref 3.5–5.2)
Sodium: 140 mmol/L (ref 134–144)
Total Protein: 7.3 g/dL (ref 6.0–8.5)
eGFR: 38 mL/min/{1.73_m2} — ABNORMAL LOW (ref >59)

## 2022-11-16 LAB — HEMOGLOBIN A1C
Est. average glucose Bld gHb Est-mCnc: 128 mg/dL
Hgb A1c MFr Bld: 6.1 % — ABNORMAL HIGH (ref 4.8–5.6)

## 2022-11-16 LAB — LIPID PANEL
Chol/HDL Ratio: 2.9 ratio (ref 0.0–4.4)
Cholesterol, Total: 171 mg/dL (ref 100–199)
HDL: 60 mg/dL (ref >39)
LDL Chol Calc (NIH): 97 mg/dL (ref 0–99)
Triglycerides: 75 mg/dL (ref 0–149)
VLDL Cholesterol Cal: 14 mg/dL (ref 5–40)

## 2022-11-16 LAB — TSH: TSH: 2.04 u[IU]/mL (ref 0.450–4.500)

## 2022-11-16 LAB — CBC WITH DIFFERENTIAL
Basophils Absolute: 0 10*3/uL (ref 0.0–0.2)
Basos: 1 %
EOS (ABSOLUTE): 0.2 10*3/uL (ref 0.0–0.4)
Eos: 5 %
Hematocrit: 38.7 % (ref 34.0–46.6)
Hemoglobin: 12.7 g/dL (ref 11.1–15.9)
Immature Grans (Abs): 0 10*3/uL (ref 0.0–0.1)
Immature Granulocytes: 0 %
Lymphocytes Absolute: 2 10*3/uL (ref 0.7–3.1)
Lymphs: 41 %
MCH: 31.4 pg (ref 26.6–33.0)
MCHC: 32.8 g/dL (ref 31.5–35.7)
MCV: 96 fL (ref 79–97)
Monocytes Absolute: 0.4 10*3/uL (ref 0.1–0.9)
Monocytes: 9 %
Neutrophils Absolute: 2.2 10*3/uL (ref 1.4–7.0)
Neutrophils: 44 %
RBC: 4.05 x10E6/uL (ref 3.77–5.28)
RDW: 12.8 % (ref 11.7–15.4)
WBC: 4.9 10*3/uL (ref 3.4–10.8)

## 2022-11-16 LAB — VITAMIN D 25 HYDROXY (VIT D DEFICIENCY, FRACTURES): Vit D, 25-Hydroxy: 27.9 ng/mL — ABNORMAL LOW (ref 30.0–100.0)

## 2022-11-16 LAB — VITAMIN B12: Vitamin B-12: 1038 pg/mL (ref 232–1245)

## 2022-11-27 ENCOUNTER — Encounter: Payer: Self-pay | Admitting: Family

## 2022-11-27 DIAGNOSIS — E782 Mixed hyperlipidemia: Secondary | ICD-10-CM | POA: Insufficient documentation

## 2022-11-27 DIAGNOSIS — R7303 Prediabetes: Secondary | ICD-10-CM | POA: Insufficient documentation

## 2022-11-27 DIAGNOSIS — E538 Deficiency of other specified B group vitamins: Secondary | ICD-10-CM | POA: Insufficient documentation

## 2022-11-27 DIAGNOSIS — E559 Vitamin D deficiency, unspecified: Secondary | ICD-10-CM | POA: Insufficient documentation

## 2022-11-27 NOTE — Assessment & Plan Note (Signed)
Patient stable.  Well controlled with current therapy.   Continue current meds.  

## 2022-11-27 NOTE — Assessment & Plan Note (Signed)
Checking labs today.  Continue current therapy for lipid control. Will modify as needed based on labwork results.  

## 2022-11-27 NOTE — Assessment & Plan Note (Addendum)
>>  ASSESSMENT AND PLAN FOR TYPE 2 DIABETES MELLITUS WITH HYPERGLYCEMIA (HCC) WRITTEN ON 11/27/2022  3:01 PM BY Miki Kins, FNP  Checking labs today. Will call pt. With results  Continue current diabetes POC, as patient has been well controlled on current regimen.  Will adjust meds if needed based on labs.    >>ASSESSMENT AND PLAN FOR PREDIABETES WRITTEN ON 11/27/2022  3:01 PM BY Mattalynn Crandle M, FNP  A1C Continues to be in prediabetic ranges.  Will reassess at follow up after next lab check.  Patient counseled on dietary choices and verbalized understanding.  Patient educated on foods that contain carbohydrates and the need to decrease intake.  We discussed prediabetes, and what it means and the need for strict dietary control to prevent progression to type 2 diabetes.  Advised to decrease intake of sugary drinks, including sodas, sweet tea, and some juices, and of starch and sugar heavy foods (ie., potatoes, rice, bread, pasta, desserts). She verbalizes understanding and agreement with the changes discussed today.

## 2022-11-27 NOTE — Progress Notes (Signed)
Established Patient Office Visit  Subjective:  Patient ID: Sandra Watts, female    DOB: [birth year]  Age: 72 y.o. MRN: [id number]  Chief Complaint  Patient presents with   Follow-up    Follow up    Patient is here today for her 3 months follow up.  She has been feeling fairly well since last appointment.   She does not have additional concerns to discuss today.  Labs are due today. She needs refills.  Is also due for colon cancer screening and her mammogram.   I have reviewed her active problem list, medication list, allergies, notes from last encounter, lab results for her appointment today.      No other concerns at this time.   Past Medical History:  Diagnosis Date   Arthritis    Hypertension    Musculoskeletal chest pain    Shortness of breath 06/16/2014    Past Surgical History:  Procedure Laterality Date   APPENDECTOMY     CARDIAC SURGERY     ablation   REPLACEMENT TOTAL KNEE      Social History   Socioeconomic History   Marital status: Single    Spouse name: Not on file   Number of children: Not on file   Years of education: Not on file   Highest education level: Not on file  Occupational History   Not on file  Tobacco Use   Smoking status: Never   Smokeless tobacco: Never  Substance and Sexual Activity   Alcohol use: Not Currently   Drug use: Never   Sexual activity: Not on file  Other Topics Concern   Not on file  Social History Narrative   Not on file   Social Determinants of Health   Financial Resource Strain: Not on file  Food Insecurity: Not on file  Transportation Needs: Not on file  Physical Activity: Not on file  Stress: Not on file  Social Connections: Not on file  Intimate Partner Violence: Not on file    Family History  Problem Relation Age of Onset   Breast cancer Cousin        paternal    Allergies  Allergen Reactions   Penicillin G Rash and Other (See Comments)    Has patient had a PCN reaction causing  immediate rash, facial/tongue/throat swelling, SOB or lightheadedness with hypotension: Yes Has patient had a PCN reaction causing severe rash involving mucus membranes or skin necrosis: no Has patient had a PCN reaction that required hospitalization no Has patient had a PCN reaction occurring within the last 10 years: no If all of the above answers are "NO", then may proceed with Cephalosporin use.    Penicillins Rash    Review of Systems  All other systems reviewed and are negative.      Objective:   BP 122/70   Pulse 70   Ht 5\' 8"  (1.727 m)   Wt 218 lb (98.9 kg)   SpO2 97%   BMI 33.15 kg/m   Vitals:   11/15/22 1137  BP: 122/70  Pulse: 70  Height: 5\' 8"  (1.727 m)  Weight: 218 lb (98.9 kg)  SpO2: 97%  BMI (Calculated): 33.15    Physical Exam Vitals and nursing note reviewed.  Constitutional:      Appearance: Normal appearance. She is obese.  HENT:     Head: Normocephalic.  Eyes:     Extraocular Movements: Extraocular movements intact.     Conjunctiva/sclera: Conjunctivae normal.     Pupils: Pupils  are equal, round, and reactive to light.  Cardiovascular:     Rate and Rhythm: Normal rate.  Pulmonary:     Effort: Pulmonary effort is normal.  Neurological:     General: No focal deficit present.     Mental Status: She is alert and oriented to person, place, and time. Mental status is at baseline.  Psychiatric:        Mood and Affect: Mood normal.        Behavior: Behavior normal.        Thought Content: Thought content normal.        Judgment: Judgment normal.      Results for orders placed or performed in visit on 11/15/22  Lipid panel  Result Value Ref Range   Cholesterol, Total 171 100 - 199 mg/dL   Triglycerides 75 0 - 149 mg/dL   HDL 60 >40 mg/dL   VLDL Cholesterol Cal 14 5 - 40 mg/dL   LDL Chol Calc (NIH) 97 0 - 99 mg/dL   Chol/HDL Ratio 2.9 0.0 - 4.4 ratio  VITAMIN D 25 Hydroxy (Vit-D Deficiency, Fractures)  Result Value Ref Range   Vit D,  25-Hydroxy 27.9 (L) 30.0 - 100.0 ng/mL  CBC With Differential  Result Value Ref Range   WBC 4.9 3.4 - 10.8 x10E3/uL   RBC 4.05 3.77 - 5.28 x10E6/uL   Hemoglobin 12.7 11.1 - 15.9 g/dL   Hematocrit 98.1 19.1 - 46.6 %   MCV 96 79 - 97 fL   MCH 31.4 26.6 - 33.0 pg   MCHC 32.8 31.5 - 35.7 g/dL   RDW 47.8 29.5 - 62.1 %   Neutrophils 44 Not Estab. %   Lymphs 41 Not Estab. %   Monocytes 9 Not Estab. %   Eos 5 Not Estab. %   Basos 1 Not Estab. %   Neutrophils Absolute 2.2 1.4 - 7.0 x10E3/uL   Lymphocytes Absolute 2.0 0.7 - 3.1 x10E3/uL   Monocytes Absolute 0.4 0.1 - 0.9 x10E3/uL   EOS (ABSOLUTE) 0.2 0.0 - 0.4 x10E3/uL   Basophils Absolute 0.0 0.0 - 0.2 x10E3/uL   Immature Granulocytes 0 Not Estab. %   Immature Grans (Abs) 0.0 0.0 - 0.1 x10E3/uL  CMP14+EGFR  Result Value Ref Range   Glucose 86 70 - 99 mg/dL   BUN 26 8 - 27 mg/dL   Creatinine, Ser 3.08 (H) 0.57 - 1.00 mg/dL   eGFR 38 (L) >65 HQ/ION/6.29   BUN/Creatinine Ratio 17 12 - 28   Sodium 140 134 - 144 mmol/L   Potassium 5.1 3.5 - 5.2 mmol/L   Chloride 102 96 - 106 mmol/L   CO2 24 20 - 29 mmol/L   Calcium 9.8 8.7 - 10.3 mg/dL   Total Protein 7.3 6.0 - 8.5 g/dL   Albumin 4.0 3.9 - 4.9 g/dL   Globulin, Total 3.3 1.5 - 4.5 g/dL   Bilirubin Total 0.6 0.0 - 1.2 mg/dL   Alkaline Phosphatase 112 44 - 121 IU/L   AST 23 0 - 40 IU/L   ALT 21 0 - 32 IU/L  TSH  Result Value Ref Range   TSH 2.040 0.450 - 4.500 uIU/mL  Hemoglobin A1c  Result Value Ref Range   Hgb A1c MFr Bld 6.1 (H) 4.8 - 5.6 %   Est. average glucose Bld gHb Est-mCnc 128 mg/dL  Vitamin B28  Result Value Ref Range   Vitamin B-12 1,038 232 - 1,245 pg/mL  POCT Urinalysis Dipstick (41324)  Result Value Ref Range  Color, UA yellow    Clarity, UA clear    Glucose, UA Positive (A) Negative   Bilirubin, UA neg    Ketones, UA neg    Spec Grav, UA 1.015 1.010 - 1.025   Blood, UA neg    pH, UA 7.0 5.0 - 8.0   Protein, UA Negative Negative   Urobilinogen, UA 0.2 0.2  or 1.0 E.U./dL   Nitrite, UA neg    Leukocytes, UA Negative Negative   Appearance clear    Odor none   POC CREATINE & ALBUMIN,URINE  Result Value Ref Range   Microalbumin Ur, POC 10 mg/L   Creatinine, POC 50 mg/dL   Albumin/Creatinine Ratio, Urine, POC 30-300     Recent Results (from the past 2160 hour(s))  POCT Urinalysis Dipstick (16109)     Status: Abnormal   Collection Time: 11/15/22 11:57 AM  Result Value Ref Range   Color, UA yellow    Clarity, UA clear    Glucose, UA Positive (A) Negative   Bilirubin, UA neg    Ketones, UA neg    Spec Grav, UA 1.015 1.010 - 1.025   Blood, UA neg    pH, UA 7.0 5.0 - 8.0   Protein, UA Negative Negative   Urobilinogen, UA 0.2 0.2 or 1.0 E.U./dL   Nitrite, UA neg    Leukocytes, UA Negative Negative   Appearance clear    Odor none   POC CREATINE & ALBUMIN,URINE     Status: None   Collection Time: 11/15/22 11:58 AM  Result Value Ref Range   Microalbumin Ur, POC 10 mg/L   Creatinine, POC 50 mg/dL   Albumin/Creatinine Ratio, Urine, POC 30-300   Lipid panel     Status: None   Collection Time: 11/15/22 12:21 PM  Result Value Ref Range   Cholesterol, Total 171 100 - 199 mg/dL   Triglycerides 75 0 - 149 mg/dL   HDL 60 >60 mg/dL   VLDL Cholesterol Cal 14 5 - 40 mg/dL   LDL Chol Calc (NIH) 97 0 - 99 mg/dL   Chol/HDL Ratio 2.9 0.0 - 4.4 ratio    Comment:                                   T. Chol/HDL Ratio                                             Men  Women                               1/2 Avg.Risk  3.4    3.3                                   Avg.Risk  5.0    4.4                                2X Avg.Risk  9.6    7.1  3X Avg.Risk 23.4   11.0   VITAMIN D 25 Hydroxy (Vit-D Deficiency, Fractures)     Status: Abnormal   Collection Time: 11/15/22 12:21 PM  Result Value Ref Range   Vit D, 25-Hydroxy 27.9 (L) 30.0 - 100.0 ng/mL    Comment: Vitamin D deficiency has been defined by the Institute of Medicine  and an Endocrine Society practice guideline as a level of serum 25-OH vitamin D less than 20 ng/mL (1,2). The Endocrine Society went on to further define vitamin D insufficiency as a level between 21 and 29 ng/mL (2). 1. IOM (Institute of Medicine). 2010. Dietary reference    intakes for calcium and D. [place]: The    [employer]. 2. Holick MF, Binkley [place], Bischoff-Ferrari HA, et al.    Evaluation, treatment, and prevention of vitamin D    deficiency: an Endocrine Society clinical practice    guideline. JCEM. 2011 Jul; 96(7):1911-30.   CBC With Differential     Status: None   Collection Time: 11/15/22 12:21 PM  Result Value Ref Range   WBC 4.9 3.4 - 10.8 x10E3/uL   RBC 4.05 3.77 - 5.28 x10E6/uL   Hemoglobin 12.7 11.1 - 15.9 g/dL   Hematocrit 16.1 09.6 - 46.6 %   MCV 96 79 - 97 fL   MCH 31.4 26.6 - 33.0 pg   MCHC 32.8 31.5 - 35.7 g/dL   RDW 04.5 40.9 - 81.1 %   Neutrophils 44 Not Estab. %   Lymphs 41 Not Estab. %   Monocytes 9 Not Estab. %   Eos 5 Not Estab. %   Basos 1 Not Estab. %   Neutrophils Absolute 2.2 1.4 - 7.0 x10E3/uL   Lymphocytes Absolute 2.0 0.7 - 3.1 x10E3/uL   Monocytes Absolute 0.4 0.1 - 0.9 x10E3/uL   EOS (ABSOLUTE) 0.2 0.0 - 0.4 x10E3/uL   Basophils Absolute 0.0 0.0 - 0.2 x10E3/uL   Immature Granulocytes 0 Not Estab. %   Immature Grans (Abs) 0.0 0.0 - 0.1 x10E3/uL    Comment: **Effective December 26, 2022, profile [id number] CBC/Differential**   (No Platelet) will be made non-orderable. [facility] Offers:   [id number] CBC With Differential/Platelet   CMP14+EGFR     Status: Abnormal   Collection Time: 11/15/22 12:21 PM  Result Value Ref Range   Glucose 86 70 - 99 mg/dL   BUN 26 8 - 27 mg/dL   Creatinine, Ser 9.56 (H) 0.57 - 1.00 mg/dL   eGFR 38 (L) >21 HY/QMV/7.84   BUN/Creatinine Ratio 17 12 - 28   Sodium 140 134 - 144 mmol/L   Potassium 5.1 3.5 - 5.2 mmol/L   Chloride 102 96 - 106 mmol/L   CO2 24 20 - 29 mmol/L   Calcium 9.8 8.7 - 10.3 mg/dL    Total Protein 7.3 6.0 - 8.5 g/dL   Albumin 4.0 3.9 - 4.9 g/dL   Globulin, Total 3.3 1.5 - 4.5 g/dL   Bilirubin Total 0.6 0.0 - 1.2 mg/dL   Alkaline Phosphatase 112 44 - 121 IU/L   AST 23 0 - 40 IU/L   ALT 21 0 - 32 IU/L  TSH     Status: None   Collection Time: 11/15/22 12:21 PM  Result Value Ref Range   TSH 2.040 0.450 - 4.500 uIU/mL  Hemoglobin A1c     Status: Abnormal   Collection Time: 11/15/22 12:21 PM  Result Value Ref Range   Hgb A1c MFr Bld 6.1 (H) 4.8 - 5.6 %    Comment:  Prediabetes: 5.7 - 6.4          Diabetes: >6.4          Glycemic control for adults with diabetes: <7.0    Est. average glucose Bld gHb Est-mCnc 128 mg/dL  Vitamin Z61     Status: None   Collection Time: 11/15/22 12:21 PM  Result Value Ref Range   Vitamin B-12 1,038 232 - 1,245 pg/mL       Assessment & Plan:   Problem List Items Addressed This Visit       Active Problems   Prediabetes    A1C Continues to be in prediabetic ranges.  Will reassess at follow up after next lab check.  Patient counseled on dietary choices and verbalized understanding.  Patient educated on foods that contain carbohydrates and the need to decrease intake.  We discussed prediabetes, and what it means and the need for strict dietary control to prevent progression to type 2 diabetes.  Advised to decrease intake of sugary drinks, including sodas, sweet tea, and some juices, and of starch and sugar heavy foods (ie., potatoes, rice, bread, pasta, desserts). She verbalizes understanding and agreement with the changes discussed today.       Relevant Orders   POCT Urinalysis Dipstick (81002) (Completed)   POC CREATINE & ALBUMIN,URINE (Completed)   CBC With Differential (Completed)   CMP14+EGFR (Completed)   Hemoglobin A1c (Completed)   Vitamin D deficiency, unspecified    Checking labs today.  Will continue supplements as needed.       Relevant Orders   VITAMIN D 25 Hydroxy (Vit-D Deficiency, Fractures)  (Completed)   CBC With Differential (Completed)   CMP14+EGFR (Completed)   Mixed hyperlipidemia    Checking labs today.  Continue current therapy for lipid control. Will modify as needed based on labwork results.       Relevant Orders   Lipid panel (Completed)   CBC With Differential (Completed)   CMP14+EGFR (Completed)   Benign essential hypertension    Blood pressure well controlled with current medications.  Continue current therapy.  Will reassess at follow up.       B12 deficiency due to diet    Checking labs today.  Will continue supplements as needed.       Relevant Orders   CBC With Differential (Completed)   CMP14+EGFR (Completed)   Vitamin B12 (Completed)   Paroxysmal A-fib (HCC)    Patient stable.  Well controlled with current therapy.   Continue current meds.       Type 2 diabetes mellitus with hyperglycemia (HCC)    Checking labs today. Will call pt. With results  Continue current diabetes POC, as patient has been well controlled on current regimen.  Will adjust meds if needed based on labs.       Other Visit Diagnoses     Screening for diabetes mellitus    -  Primary   Other fatigue       Relevant Orders   CBC With Differential (Completed)   CMP14+EGFR (Completed)   TSH (Completed)   Encounter for screening mammogram for breast cancer       Mammogram ordered today.   Relevant Orders   MM 3D SCREENING MAMMOGRAM BILATERAL BREAST   Colon cancer screening       Ordering Cologuard.   Relevant Orders   Cologuard       Return in about 3 months (around 02/15/2023) for F/U.   Total time spent: 30 minutes  Meril Dray Daine Gravel, FNP  11/15/2022   This document may have been prepared by Dragon Voice Recognition software and as such may include unintentional dictation errors.

## 2022-11-27 NOTE — Assessment & Plan Note (Signed)
Checking labs today.  Will continue supplements as needed.  

## 2022-11-27 NOTE — Assessment & Plan Note (Signed)
A1C Continues to be in prediabetic ranges.  Will reassess at follow up after next lab check.  Patient counseled on dietary choices and verbalized understanding.  Patient educated on foods that contain carbohydrates and the need to decrease intake.  We discussed prediabetes, and what it means and the need for strict dietary control to prevent progression to type 2 diabetes.  Advised to decrease intake of sugary drinks, including sodas, sweet tea, and some juices, and of starch and sugar heavy foods (ie., potatoes, rice, bread, pasta, desserts). She verbalizes understanding and agreement with the changes discussed today.  

## 2022-11-27 NOTE — Assessment & Plan Note (Signed)
Blood pressure well controlled with current medications.  Continue current therapy.  Will reassess at follow up.  

## 2022-11-28 NOTE — Progress Notes (Signed)
Patient notified

## 2022-11-28 NOTE — Progress Notes (Signed)
LVM requesting call back about labs.

## 2022-11-29 ENCOUNTER — Other Ambulatory Visit: Payer: 59

## 2022-11-29 DIAGNOSIS — I10 Essential (primary) hypertension: Secondary | ICD-10-CM | POA: Diagnosis not present

## 2022-11-30 LAB — CMP14+EGFR
ALT: 19 IU/L (ref 0–32)
AST: 22 IU/L (ref 0–40)
Albumin: 3.7 g/dL — ABNORMAL LOW (ref 3.9–4.9)
Alkaline Phosphatase: 105 IU/L (ref 44–121)
BUN/Creatinine Ratio: 20 (ref 12–28)
BUN: 26 mg/dL (ref 8–27)
Bilirubin Total: 0.5 mg/dL (ref 0.0–1.2)
CO2: 24 mmol/L (ref 20–29)
Calcium: 9.4 mg/dL (ref 8.7–10.3)
Chloride: 103 mmol/L (ref 96–106)
Creatinine, Ser: 1.27 mg/dL — ABNORMAL HIGH (ref 0.57–1.00)
Globulin, Total: 3.3 g/dL (ref 1.5–4.5)
Glucose: 101 mg/dL — ABNORMAL HIGH (ref 70–99)
Potassium: 5.2 mmol/L (ref 3.5–5.2)
Sodium: 140 mmol/L (ref 134–144)
Total Protein: 7 g/dL (ref 6.0–8.5)
eGFR: 45 mL/min/{1.73_m2} — ABNORMAL LOW (ref >59)

## 2022-12-05 ENCOUNTER — Telehealth: Payer: Self-pay | Admitting: Family

## 2022-12-05 NOTE — Telephone Encounter (Signed)
Patient called wanting her lab results from last week. Results given to patient.

## 2022-12-06 NOTE — Progress Notes (Signed)
Patient notified

## 2022-12-13 ENCOUNTER — Other Ambulatory Visit: Payer: Self-pay | Admitting: Family

## 2023-01-21 ENCOUNTER — Other Ambulatory Visit: Payer: Self-pay | Admitting: Family

## 2023-02-04 ENCOUNTER — Other Ambulatory Visit: Payer: Self-pay | Admitting: Family

## 2023-02-06 ENCOUNTER — Other Ambulatory Visit: Payer: Self-pay | Admitting: Family

## 2023-02-07 ENCOUNTER — Ambulatory Visit
Admission: RE | Admit: 2023-02-07 | Discharge: 2023-02-07 | Disposition: A | Discharge status: Home / Self Care | Payer: 59 | Source: Ambulatory Visit | Attending: Family | Admitting: Family

## 2023-02-07 DIAGNOSIS — Z1231 Encounter for screening mammogram for malignant neoplasm of breast: Secondary | ICD-10-CM | POA: Insufficient documentation

## 2023-02-21 ENCOUNTER — Ambulatory Visit: Payer: 59 | Admitting: Family

## 2023-04-04 ENCOUNTER — Other Ambulatory Visit: Payer: Self-pay | Admitting: Family

## 2023-04-04 MED ORDER — SUCRALFATE 1 GM/10ML PO SUSP: 1.0000 g | Freq: Four times a day (QID) | ORAL | 0 refills | Status: DC

## 2023-04-09 ENCOUNTER — Other Ambulatory Visit: Payer: Self-pay | Admitting: Family

## 2023-04-21 ENCOUNTER — Other Ambulatory Visit: Payer: Self-pay | Admitting: Family

## 2023-04-23 ENCOUNTER — Other Ambulatory Visit: Payer: Self-pay | Admitting: Family

## 2023-05-02 DIAGNOSIS — M2041 Other hammer toe(s) (acquired), right foot: Secondary | ICD-10-CM | POA: Diagnosis not present

## 2023-05-02 DIAGNOSIS — B351 Tinea unguium: Secondary | ICD-10-CM | POA: Diagnosis not present

## 2023-05-02 DIAGNOSIS — M898X9 Other specified disorders of bone, unspecified site: Secondary | ICD-10-CM | POA: Diagnosis not present

## 2023-05-02 DIAGNOSIS — M2042 Other hammer toe(s) (acquired), left foot: Secondary | ICD-10-CM | POA: Diagnosis not present

## 2023-05-02 DIAGNOSIS — M79674 Pain in right toe(s): Secondary | ICD-10-CM | POA: Diagnosis not present

## 2023-05-02 DIAGNOSIS — L600 Ingrowing nail: Secondary | ICD-10-CM | POA: Diagnosis not present

## 2023-05-02 DIAGNOSIS — M79675 Pain in left toe(s): Secondary | ICD-10-CM | POA: Diagnosis not present

## 2023-06-03 ENCOUNTER — Other Ambulatory Visit: Payer: Self-pay | Admitting: Family

## 2023-07-20 ENCOUNTER — Other Ambulatory Visit: Payer: Self-pay | Admitting: Family

## 2023-07-27 ENCOUNTER — Other Ambulatory Visit: Payer: Self-pay | Admitting: Family

## 2023-08-01 ENCOUNTER — Encounter: Payer: Self-pay | Admitting: Family

## 2023-08-01 ENCOUNTER — Ambulatory Visit (INDEPENDENT_AMBULATORY_CARE_PROVIDER_SITE_OTHER): Payer: 59 | Admitting: Family

## 2023-08-01 VITALS — BP 102/64 | HR 71 | Ht 68.0 in | Wt 212.8 lb

## 2023-08-01 DIAGNOSIS — R7303 Prediabetes: Secondary | ICD-10-CM | POA: Diagnosis not present

## 2023-08-01 DIAGNOSIS — E538 Deficiency of other specified B group vitamins: Secondary | ICD-10-CM

## 2023-08-01 DIAGNOSIS — R1084 Generalized abdominal pain: Secondary | ICD-10-CM | POA: Diagnosis not present

## 2023-08-01 DIAGNOSIS — I10 Essential (primary) hypertension: Secondary | ICD-10-CM

## 2023-08-01 DIAGNOSIS — R5383 Other fatigue: Secondary | ICD-10-CM | POA: Diagnosis not present

## 2023-08-01 DIAGNOSIS — E782 Mixed hyperlipidemia: Secondary | ICD-10-CM | POA: Diagnosis not present

## 2023-08-01 DIAGNOSIS — J42 Unspecified chronic bronchitis: Secondary | ICD-10-CM

## 2023-08-01 DIAGNOSIS — I480 Paroxysmal atrial fibrillation: Secondary | ICD-10-CM

## 2023-08-01 DIAGNOSIS — E1165 Type 2 diabetes mellitus with hyperglycemia: Secondary | ICD-10-CM | POA: Diagnosis not present

## 2023-08-01 DIAGNOSIS — N6315 Unspecified lump in the right breast, overlapping quadrants: Secondary | ICD-10-CM

## 2023-08-01 DIAGNOSIS — E559 Vitamin D deficiency, unspecified: Secondary | ICD-10-CM | POA: Diagnosis not present

## 2023-08-01 MED ORDER — FARXIGA 10 MG PO TABS: 10.0000 mg | ORAL_TABLET | Freq: Every day | ORAL | 0 refills | Status: DC

## 2023-08-01 MED ORDER — CETIRIZINE HCL 10 MG PO TABS: 10.0000 mg | ORAL_TABLET | Freq: Every day | ORAL | 3 refills | Status: AC

## 2023-08-01 MED ORDER — LISINOPRIL 10 MG PO TABS: 10.0000 mg | ORAL_TABLET | Freq: Every day | ORAL | 0 refills | Status: DC

## 2023-08-01 MED ORDER — ATORVASTATIN CALCIUM 40 MG PO TABS: 40.0000 mg | ORAL_TABLET | Freq: Every day | ORAL | 0 refills | Status: DC

## 2023-08-01 NOTE — Patient Instructions (Addendum)
 Getting labs today,   Schedule ultrasound with [facility],  Phone number: [phone] Address:  [address] [place], [place] [address]

## 2023-08-01 NOTE — Progress Notes (Signed)
 Established Patient Office Visit  Subjective:  Patient ID: Sandra Watts, female    DOB: May 20, 1951  Age: 73 y.o. MRN: [id number]  Chief Complaint  Patient presents with   Follow-up    6 month follow up    Patient is here today for her 6 months follow up.  She has been feeling fairly well since last appointment.   She does have additional concerns to discuss today.  Pain in stomach when she eats, just about every time.  Right side of stomach causes her to have pains.  Labs are due today. She needs refills.   I have reviewed her active problem list, medication list, allergies, notes from last encounter, lab results for her appointment today.      No other concerns at this time.   Past Medical History:  Diagnosis Date   Arthritis    Hypertension    Musculoskeletal chest pain    Shortness of breath 06/16/2014    Past Surgical History:  Procedure Laterality Date   APPENDECTOMY     CARDIAC SURGERY     ablation   REPLACEMENT TOTAL KNEE      Social History   Socioeconomic History   Marital status: Single    Spouse name: Not on file   Number of children: Not on file   Years of education: Not on file   Highest education level: Not on file  Occupational History   Not on file  Tobacco Use   Smoking status: Never   Smokeless tobacco: Never  Substance and Sexual Activity   Alcohol use: Not Currently   Drug use: Never   Sexual activity: Not on file  Other Topics Concern   Not on file  Social History Narrative   Not on file   Social Drivers of Health   Financial Resource Strain: Low Risk  (05/02/2023)   Received from [facility]   Overall Financial Resource Strain (CARDIA)    Difficulty of Paying Living Expenses: Not very hard  Food Insecurity: Food Insecurity Present (05/02/2023)   Received from [facility]   Hunger Vital Sign    Worried About Running Out of Food in the Last Year: Never true    Ran Out of Food in the  Last Year: Often true  Transportation Needs: No Transportation Needs (05/02/2023)   Received from [facility] - Transportation    In the past 12 months, has lack of transportation kept you from medical appointments or from getting medications?: No    Lack of Transportation (Non-Medical): No  Physical Activity: Not on file  Stress: Not on file  Social Connections: Not on file  Intimate Partner Violence: Not on file    Family History  Problem Relation Age of Onset   Breast cancer Cousin        paternal    Allergies  Allergen Reactions   Penicillin G Rash and Other (See Comments)    Has patient had a PCN reaction causing immediate rash, facial/tongue/throat swelling, SOB or lightheadedness with hypotension: Yes Has patient had a PCN reaction causing severe rash involving mucus membranes or skin necrosis: no Has patient had a PCN reaction that required hospitalization no Has patient had a PCN reaction occurring within the last 10 years: no If all of the above answers are "NO", then may proceed with Cephalosporin use.    Penicillins Rash    Review of Systems  Gastrointestinal:  Positive for abdominal pain and  nausea. Negative for vomiting.  All other systems reviewed and are negative.      Objective:   BP 102/64   Pulse 71   Ht 5\' 8"  (1.727 m)   Wt 212 lb 12.8 oz (96.5 kg)   SpO2 98%   BMI 32.36 kg/m   Vitals:   08/01/23 1430  BP: 102/64  Pulse: 71  Height: 5\' 8"  (1.727 m)  Weight: 212 lb 12.8 oz (96.5 kg)  SpO2: 98%  BMI (Calculated): 32.36    Physical Exam Vitals and nursing note reviewed.  Constitutional:      Appearance: Normal appearance. She is normal weight.  HENT:     Head: Normocephalic.  Eyes:     Extraocular Movements: Extraocular movements intact.     Conjunctiva/sclera: Conjunctivae normal.     Pupils: Pupils are equal, round, and reactive to light.  Cardiovascular:     Rate and Rhythm: Normal rate.  Pulmonary:      Effort: Pulmonary effort is normal.  Abdominal:     Tenderness: There is abdominal tenderness.  Neurological:     General: No focal deficit present.     Mental Status: She is alert and oriented to person, place, and time. Mental status is at baseline.  Psychiatric:        Mood and Affect: Mood normal.        Behavior: Behavior normal.        Thought Content: Thought content normal.        Judgment: Judgment normal.      Results for orders placed or performed in visit on 08/01/23  Lipase  Result Value Ref Range   Lipase 49 14 - 72 U/L  Amylase  Result Value Ref Range   Amylase 135 (H) 31 - 110 U/L  CBC with Diff  Result Value Ref Range   WBC 5.1 3.4 - 10.8 x10E3/uL   RBC 3.95 3.77 - 5.28 x10E6/uL   Hemoglobin 12.4 11.1 - 15.9 g/dL   Hematocrit 16.1 09.6 - 46.6 %   MCV 96 79 - 97 fL   MCH 31.4 26.6 - 33.0 pg   MCHC 32.7 31.5 - 35.7 g/dL   RDW 04.5 40.9 - 81.1 %   Platelets 217 150 - 450 x10E3/uL   Neutrophils 42 Not Estab. %   Lymphs 43 Not Estab. %   Monocytes 10 Not Estab. %   Eos 4 Not Estab. %   Basos 1 Not Estab. %   Neutrophils Absolute 2.1 1.4 - 7.0 x10E3/uL   Lymphocytes Absolute 2.3 0.7 - 3.1 x10E3/uL   Monocytes Absolute 0.5 0.1 - 0.9 x10E3/uL   EOS (ABSOLUTE) 0.2 0.0 - 0.4 x10E3/uL   Basophils Absolute 0.0 0.0 - 0.2 x10E3/uL   Immature Granulocytes 0 Not Estab. %   Immature Grans (Abs) 0.0 0.0 - 0.1 x10E3/uL  CRP (C-Reactive Protein)  Result Value Ref Range   CRP 10 0 - 10 mg/L  Sed Rate (ESR)  Result Value Ref Range   Sed Rate 53 (H) 0 - 40 mm/hr  Lipid panel  Result Value Ref Range   Cholesterol, Total 158 100 - 199 mg/dL   Triglycerides 914 0 - 149 mg/dL   HDL 56 >78 mg/dL   VLDL Cholesterol Cal 20 5 - 40 mg/dL   LDL Chol Calc (NIH) 82 0 - 99 mg/dL   Chol/HDL Ratio 2.8 0.0 - 4.4 ratio  VITAMIN D 25 Hydroxy (Vit-D Deficiency, Fractures)  Result Value Ref Range   Vit D, 25-Hydroxy  31.1 30.0 - 100.0 ng/mL  CMP14+EGFR  Result Value Ref Range    Glucose 82 70 - 99 mg/dL   BUN 18 8 - 27 mg/dL   Creatinine, Ser 1.61 (H) 0.57 - 1.00 mg/dL   eGFR 52 (L) >09 UE/AVW/0.98   BUN/Creatinine Ratio 16 12 - 28   Sodium 139 134 - 144 mmol/L   Potassium 4.8 3.5 - 5.2 mmol/L   Chloride 103 96 - 106 mmol/L   CO2 25 20 - 29 mmol/L   Calcium 9.3 8.7 - 10.3 mg/dL   Total Protein 7.2 6.0 - 8.5 g/dL   Albumin 3.8 (L) 3.9 - 4.9 g/dL   Globulin, Total 3.4 1.5 - 4.5 g/dL   Bilirubin Total 0.4 0.0 - 1.2 mg/dL   Alkaline Phosphatase 109 44 - 121 IU/L   AST 18 0 - 40 IU/L   ALT 17 0 - 32 IU/L  TSH  Result Value Ref Range   TSH 1.390 0.450 - 4.500 uIU/mL  Hemoglobin A1c  Result Value Ref Range   Hgb A1c MFr Bld 6.1 (H) 4.8 - 5.6 %   Est. average glucose Bld gHb Est-mCnc 128 mg/dL  Vitamin J19  Result Value Ref Range   Vitamin B-12 993 232 - 1,245 pg/mL    Recent Results (from the past 2160 hours)  Lipase     Status: None   Collection Time: 08/01/23  3:21 PM  Result Value Ref Range   Lipase 49 14 - 72 U/L  Amylase     Status: Abnormal   Collection Time: 08/01/23  3:21 PM  Result Value Ref Range   Amylase 135 (H) 31 - 110 U/L  CBC with Diff     Status: None   Collection Time: 08/01/23  3:21 PM  Result Value Ref Range   WBC 5.1 3.4 - 10.8 x10E3/uL   RBC 3.95 3.77 - 5.28 x10E6/uL   Hemoglobin 12.4 11.1 - 15.9 g/dL   Hematocrit 14.7 82.9 - 46.6 %   MCV 96 79 - 97 fL   MCH 31.4 26.6 - 33.0 pg   MCHC 32.7 31.5 - 35.7 g/dL   RDW 56.2 13.0 - 86.5 %   Platelets 217 150 - 450 x10E3/uL   Neutrophils 42 Not Estab. %   Lymphs 43 Not Estab. %   Monocytes 10 Not Estab. %   Eos 4 Not Estab. %   Basos 1 Not Estab. %   Neutrophils Absolute 2.1 1.4 - 7.0 x10E3/uL   Lymphocytes Absolute 2.3 0.7 - 3.1 x10E3/uL   Monocytes Absolute 0.5 0.1 - 0.9 x10E3/uL   EOS (ABSOLUTE) 0.2 0.0 - 0.4 x10E3/uL   Basophils Absolute 0.0 0.0 - 0.2 x10E3/uL   Immature Granulocytes 0 Not Estab. %   Immature Grans (Abs) 0.0 0.0 - 0.1 x10E3/uL  CRP (C-Reactive Protein)      Status: None   Collection Time: 08/01/23  3:21 PM  Result Value Ref Range   CRP 10 0 - 10 mg/L  Sed Rate (ESR)     Status: Abnormal   Collection Time: 08/01/23  3:21 PM  Result Value Ref Range   Sed Rate 53 (H) 0 - 40 mm/hr  Lipid panel     Status: None   Collection Time: 08/01/23  3:21 PM  Result Value Ref Range   Cholesterol, Total 158 100 - 199 mg/dL   Triglycerides 784 0 - 149 mg/dL   HDL 56 >69 mg/dL   VLDL Cholesterol Cal 20 5 - 40 mg/dL  LDL Chol Calc (NIH) 82 0 - 99 mg/dL   Chol/HDL Ratio 2.8 0.0 - 4.4 ratio    Comment:                                   T. Chol/HDL Ratio                                             Men  Women                               1/2 Avg.Risk  3.4    3.3                                   Avg.Risk  5.0    4.4                                2X Avg.Risk  9.6    7.1                                3X Avg.Risk 23.4   11.0   VITAMIN D 25 Hydroxy (Vit-D Deficiency, Fractures)     Status: None   Collection Time: 08/01/23  3:21 PM  Result Value Ref Range   Vit D, 25-Hydroxy 31.1 30.0 - 100.0 ng/mL    Comment: Vitamin D deficiency has been defined by the Institute of Medicine and an Endocrine Society practice guideline as a level of serum 25-OH vitamin D less than 20 ng/mL (1,2). The Endocrine Society went on to further define vitamin D insufficiency as a level between 21 and 29 ng/mL (2). 1. IOM (Institute of Medicine). 2010. Dietary reference    intakes for calcium and D. [place]: The    [employer]. 2. Holick MF, Binkley Shorter, Bischoff-Ferrari HA, et al.    Evaluation, treatment, and prevention of vitamin D    deficiency: an Endocrine Society clinical practice    guideline. JCEM. 2011 Jul; 96(7):1911-30.   CMP14+EGFR     Status: Abnormal   Collection Time: 08/01/23  3:21 PM  Result Value Ref Range   Glucose 82 70 - 99 mg/dL   BUN 18 8 - 27 mg/dL   Creatinine, Ser 1.61 (H) 0.57 - 1.00 mg/dL   eGFR 52 (L) >09 UE/AVW/0.98    BUN/Creatinine Ratio 16 12 - 28   Sodium 139 134 - 144 mmol/L   Potassium 4.8 3.5 - 5.2 mmol/L   Chloride 103 96 - 106 mmol/L   CO2 25 20 - 29 mmol/L   Calcium 9.3 8.7 - 10.3 mg/dL   Total Protein 7.2 6.0 - 8.5 g/dL   Albumin 3.8 (L) 3.9 - 4.9 g/dL   Globulin, Total 3.4 1.5 - 4.5 g/dL   Bilirubin Total 0.4 0.0 - 1.2 mg/dL   Alkaline Phosphatase 109 44 - 121 IU/L   AST 18 0 - 40 IU/L   ALT 17 0 - 32 IU/L  TSH     Status: None   Collection Time: 08/01/23  3:21 PM  Result Value Ref Range  TSH 1.390 0.450 - 4.500 uIU/mL  Hemoglobin A1c     Status: Abnormal   Collection Time: 08/01/23  3:21 PM  Result Value Ref Range   Hgb A1c MFr Bld 6.1 (H) 4.8 - 5.6 %    Comment:          Prediabetes: 5.7 - 6.4          Diabetes: >6.4          Glycemic control for adults with diabetes: <7.0    Est. average glucose Bld gHb Est-mCnc 128 mg/dL  Vitamin U98     Status: None   Collection Time: 08/01/23  3:21 PM  Result Value Ref Range   Vitamin B-12 993 232 - 1,245 pg/mL       Assessment & Plan:   Problem List Items Addressed This Visit       Cardiovascular and Mediastinum   Essential hypertension, benign   Blood pressure well controlled with current medications.  Continue current therapy.  Will reassess at follow up.        Relevant Medications   atorvastatin (LIPITOR) 40 MG tablet   lisinopril (ZESTRIL) 10 MG tablet   Other Relevant Orders   CBC with Diff (Completed)   CMP14+EGFR (Completed)   TSH (Completed)   Paroxysmal A-fib (HCC)   Patient is seen by Cardiology, who manage this condition.  She is well controlled with current therapy.   Will defer to them for further changes to plan of care.       Relevant Medications   atorvastatin (LIPITOR) 40 MG tablet   lisinopril (ZESTRIL) 10 MG tablet     Respiratory   Chronic bronchitis, unspecified chronic bronchitis type (HCC)   Patient stable.  Well controlled with current therapy.   Continue current meds.           Endocrine   Type 2 diabetes mellitus with hyperglycemia (HCC)   Checking labs today. Will call pt. With results  Continue current diabetes POC, as patient has been well controlled on current regimen.  Will adjust meds if needed based on labs.       Relevant Medications   atorvastatin (LIPITOR) 40 MG tablet   FARXIGA 10 MG TABS tablet   lisinopril (ZESTRIL) 10 MG tablet   Other Relevant Orders   CBC with Diff (Completed)   CMP14+EGFR (Completed)   TSH (Completed)   Hemoglobin A1c (Completed)     Other   Vitamin D deficiency, unspecified   Checking labs today.  Will continue supplements as needed.        Relevant Orders   CBC with Diff (Completed)   VITAMIN D 25 Hydroxy (Vit-D Deficiency, Fractures) (Completed)   CMP14+EGFR (Completed)   TSH (Completed)   Mixed hyperlipidemia   Checking labs today.  Continue current therapy for lipid control. Will modify as needed based on labwork results.        Relevant Medications   atorvastatin (LIPITOR) 40 MG tablet   lisinopril (ZESTRIL) 10 MG tablet   Other Relevant Orders   CBC with Diff (Completed)   Lipid panel (Completed)   CMP14+EGFR (Completed)   TSH (Completed)   B12 deficiency due to diet   Checking labs today.  Will continue supplements as needed.        Relevant Orders   CBC with Diff (Completed)   CMP14+EGFR (Completed)   TSH (Completed)   Vitamin B12 (Completed)   Other Visit Diagnoses       Generalized abdominal pain    -  Primary   getting ultrasound. Will consider CT scan if WNL. Also setting up GI referral.   Relevant Orders   US Abdomen Complete (Completed)   Lipase (Completed)   Amylase (Completed)   CBC with Diff (Completed)   CRP (C-Reactive Protein) (Completed)   Sed Rate (ESR) (Completed)   CMP14+EGFR (Completed)   TSH (Completed)     Other fatigue       Relevant Orders   CBC with Diff (Completed)   CMP14+EGFR (Completed)   TSH (Completed)     Breast lump on right side at 12  o'clock position       sending for ultrasound.  Will defer to [facility] department for further changes.   Relevant Orders   [place] LIMITED ULTRASOUND INCLUDING AXILLA RIGHT BREAST       Return in about 3 months (around 11/01/2023).   Total time spent: 20 minutes  Miki Kins, FNP  08/01/2023   This document may have been prepared by [facility] Voice Recognition software and as such may include unintentional dictation errors.

## 2023-08-02 LAB — CMP14+EGFR
ALT: 17 IU/L (ref 0–32)
AST: 18 IU/L (ref 0–40)
Albumin: 3.8 g/dL — ABNORMAL LOW (ref 3.9–4.9)
Alkaline Phosphatase: 109 IU/L (ref 44–121)
BUN/Creatinine Ratio: 16 (ref 12–28)
BUN: 18 mg/dL (ref 8–27)
Bilirubin Total: 0.4 mg/dL (ref 0.0–1.2)
CO2: 25 mmol/L (ref 20–29)
Calcium: 9.3 mg/dL (ref 8.7–10.3)
Chloride: 103 mmol/L (ref 96–106)
Creatinine, Ser: 1.13 mg/dL — ABNORMAL HIGH (ref 0.57–1.00)
Globulin, Total: 3.4 g/dL (ref 1.5–4.5)
Glucose: 82 mg/dL (ref 70–99)
Potassium: 4.8 mmol/L (ref 3.5–5.2)
Sodium: 139 mmol/L (ref 134–144)
Total Protein: 7.2 g/dL (ref 6.0–8.5)
eGFR: 52 mL/min/{1.73_m2} — ABNORMAL LOW (ref >59)

## 2023-08-02 LAB — CBC WITH DIFFERENTIAL/PLATELET
Basophils Absolute: 0 10*3/uL (ref 0.0–0.2)
Basos: 1 %
EOS (ABSOLUTE): 0.2 10*3/uL (ref 0.0–0.4)
Eos: 4 %
Hematocrit: 37.9 % (ref 34.0–46.6)
Hemoglobin: 12.4 g/dL (ref 11.1–15.9)
Immature Grans (Abs): 0 10*3/uL (ref 0.0–0.1)
Immature Granulocytes: 0 %
Lymphocytes Absolute: 2.3 10*3/uL (ref 0.7–3.1)
Lymphs: 43 %
MCH: 31.4 pg (ref 26.6–33.0)
MCHC: 32.7 g/dL (ref 31.5–35.7)
MCV: 96 fL (ref 79–97)
Monocytes Absolute: 0.5 10*3/uL (ref 0.1–0.9)
Monocytes: 10 %
Neutrophils Absolute: 2.1 10*3/uL (ref 1.4–7.0)
Neutrophils: 42 %
Platelets: 217 10*3/uL (ref 150–450)
RBC: 3.95 x10E6/uL (ref 3.77–5.28)
RDW: 12.8 % (ref 11.7–15.4)
WBC: 5.1 10*3/uL (ref 3.4–10.8)

## 2023-08-02 LAB — LIPID PANEL
Chol/HDL Ratio: 2.8 ratio (ref 0.0–4.4)
Cholesterol, Total: 158 mg/dL (ref 100–199)
HDL: 56 mg/dL (ref >39)
LDL Chol Calc (NIH): 82 mg/dL (ref 0–99)
Triglycerides: 112 mg/dL (ref 0–149)
VLDL Cholesterol Cal: 20 mg/dL (ref 5–40)

## 2023-08-02 LAB — VITAMIN D 25 HYDROXY (VIT D DEFICIENCY, FRACTURES): Vit D, 25-Hydroxy: 31.1 ng/mL (ref 30.0–100.0)

## 2023-08-02 LAB — C-REACTIVE PROTEIN: CRP: 10 mg/L (ref 0–10)

## 2023-08-02 LAB — HEMOGLOBIN A1C
Est. average glucose Bld gHb Est-mCnc: 128 mg/dL
Hgb A1c MFr Bld: 6.1 % — ABNORMAL HIGH (ref 4.8–5.6)

## 2023-08-02 LAB — SEDIMENTATION RATE: Sed Rate: 53 mm/h — ABNORMAL HIGH (ref 0–40)

## 2023-08-02 LAB — LIPASE: Lipase: 49 U/L (ref 14–72)

## 2023-08-02 LAB — TSH: TSH: 1.39 u[IU]/mL (ref 0.450–4.500)

## 2023-08-02 LAB — AMYLASE: Amylase: 135 U/L — ABNORMAL HIGH (ref 31–110)

## 2023-08-02 LAB — VITAMIN B12: Vitamin B-12: 993 pg/mL (ref 232–1245)

## 2023-08-08 ENCOUNTER — Encounter: Payer: Self-pay | Admitting: Family

## 2023-08-08 ENCOUNTER — Ambulatory Visit
Admission: RE | Admit: 2023-08-08 | Discharge: 2023-08-08 | Disposition: A | Discharge status: Home / Self Care | Source: Ambulatory Visit | Attending: Family | Admitting: Family

## 2023-08-08 DIAGNOSIS — K760 Fatty (change of) liver, not elsewhere classified: Secondary | ICD-10-CM | POA: Diagnosis not present

## 2023-08-08 DIAGNOSIS — R1010 Upper abdominal pain, unspecified: Secondary | ICD-10-CM | POA: Diagnosis not present

## 2023-08-08 DIAGNOSIS — R1084 Generalized abdominal pain: Secondary | ICD-10-CM

## 2023-08-08 DIAGNOSIS — K824 Cholesterolosis of gallbladder: Secondary | ICD-10-CM | POA: Diagnosis not present

## 2023-08-18 ENCOUNTER — Other Ambulatory Visit: Payer: Self-pay

## 2023-08-18 DIAGNOSIS — R932 Abnormal findings on diagnostic imaging of liver and biliary tract: Secondary | ICD-10-CM

## 2023-08-22 ENCOUNTER — Other Ambulatory Visit: Payer: Self-pay | Admitting: Family

## 2023-08-22 DIAGNOSIS — N6315 Unspecified lump in the right breast, overlapping quadrants: Secondary | ICD-10-CM

## 2023-08-26 ENCOUNTER — Encounter: Payer: Self-pay | Admitting: Family

## 2023-08-26 DIAGNOSIS — J42 Unspecified chronic bronchitis: Secondary | ICD-10-CM | POA: Insufficient documentation

## 2023-08-26 NOTE — Assessment & Plan Note (Signed)
 Checking labs today. Will call pt. With results  Continue current diabetes POC, as patient has been well controlled on current regimen.  Will adjust meds if needed based on labs.

## 2023-08-26 NOTE — Assessment & Plan Note (Signed)
 Checking labs today.  Will continue supplements as needed.

## 2023-08-26 NOTE — Assessment & Plan Note (Signed)
 Checking labs today.  Continue current therapy for lipid control. Will modify as needed based on labwork results.

## 2023-08-26 NOTE — Assessment & Plan Note (Signed)
 Patient is seen by Cardiology, who manage this condition.  She is well controlled with current therapy.   Will defer to them for further changes to plan of care.

## 2023-08-26 NOTE — Assessment & Plan Note (Signed)
 Patient stable.  Well controlled with current therapy.   Continue current meds.

## 2023-08-26 NOTE — Assessment & Plan Note (Signed)
 Blood pressure well controlled with current medications.  Continue current therapy.  Will reassess at follow up.

## 2023-09-01 ENCOUNTER — Ambulatory Visit: Admitting: Family

## 2023-09-12 ENCOUNTER — Ambulatory Visit
Admission: RE | Admit: 2023-09-12 | Discharge: 2023-09-12 | Disposition: A | Discharge status: Home / Self Care | Source: Ambulatory Visit | Attending: Family | Admitting: Family

## 2023-09-12 DIAGNOSIS — N6310 Unspecified lump in the right breast, unspecified quadrant: Secondary | ICD-10-CM | POA: Diagnosis not present

## 2023-09-12 DIAGNOSIS — R92321 Mammographic fibroglandular density, right breast: Secondary | ICD-10-CM | POA: Diagnosis not present

## 2023-09-12 DIAGNOSIS — N6315 Unspecified lump in the right breast, overlapping quadrants: Secondary | ICD-10-CM | POA: Diagnosis not present

## 2023-09-12 DIAGNOSIS — N644 Mastodynia: Secondary | ICD-10-CM | POA: Diagnosis not present

## 2023-09-19 ENCOUNTER — Ambulatory Visit (INDEPENDENT_AMBULATORY_CARE_PROVIDER_SITE_OTHER): Admitting: Family

## 2023-09-19 ENCOUNTER — Encounter: Payer: Self-pay | Admitting: Family

## 2023-09-19 VITALS — BP 134/84 | HR 52 | Ht 68.0 in | Wt 215.2 lb

## 2023-09-19 DIAGNOSIS — R1084 Generalized abdominal pain: Secondary | ICD-10-CM | POA: Diagnosis not present

## 2023-09-19 DIAGNOSIS — Z0130 Encounter for examination of blood pressure without abnormal findings: Secondary | ICD-10-CM

## 2023-09-19 DIAGNOSIS — R932 Abnormal findings on diagnostic imaging of liver and biliary tract: Secondary | ICD-10-CM

## 2023-09-19 MED ORDER — SUCRALFATE 1 GM/10ML PO SUSP: 1.0000 g | Freq: Three times a day (TID) | ORAL | 0 refills | Status: DC

## 2023-09-19 MED ORDER — CHOLECALCIFEROL 1.25 MG (50000 UT) PO CAPS: 50000.0000 [IU] | ORAL_CAPSULE | ORAL | 3 refills | Status: AC

## 2023-09-19 NOTE — Progress Notes (Signed)
 Established Patient Office Visit  Subjective:  Patient ID: Sandra Watts, female    DOB: February 16, 1951  Age: 73 y.o. MRN: [id number]  Chief Complaint  Patient presents with   Follow-up    1 month follow up    Patient is here today for her  follow up.  She has been feeling fairly well since last appointment.   She does not have additional concerns to discuss today.  Ultrasound showed a gallbladder polyp.   Labs are not due today.  She needs refills.   I have reviewed her active problem list, medication list, allergies, notes from last encounter, lab results, imaging for her appointment today.      No other concerns at this time.   Past Medical History:  Diagnosis Date   Arthritis    Hypertension    Musculoskeletal chest pain    Shortness of breath 06/16/2014    Past Surgical History:  Procedure Laterality Date   APPENDECTOMY     CARDIAC SURGERY     ablation   REPLACEMENT TOTAL KNEE      Social History   Socioeconomic History   Marital status: Single    Spouse name: Not on file   Number of children: Not on file   Years of education: Not on file   Highest education level: Not on file  Occupational History   Not on file  Tobacco Use   Smoking status: Never   Smokeless tobacco: Never  Substance and Sexual Activity   Alcohol use: Not Currently   Drug use: Never   Sexual activity: Not on file  Other Topics Concern   Not on file  Social History Narrative   Not on file   Social Drivers of Health   Financial Resource Strain: Patient Declined (12/12/2023)   Received from [facility]   Overall Financial Resource Strain (CARDIA)    Difficulty of Paying Living Expenses: Patient declined  Food Insecurity: Patient Declined (12/12/2023)   Received from [facility]   Hunger Vital Sign    Within the past 12 months, you worried that your food would run out before you got the money to buy more.: Patient declined    Within the past  12 months, the food you bought just didn't last and you didn't have money to get more.: Patient declined  Transportation Needs: Patient Declined (12/12/2023)   Received from [facility] - Transportation    In the past 12 months, has lack of transportation kept you from medical appointments or from getting medications?: Patient declined    Lack of Transportation (Non-Medical): Patient declined  Physical Activity: Not on file  Stress: Not on file  Social Connections: Not on file  Intimate Partner Violence: Not on file    Family History  Problem Relation Age of Onset   Breast cancer Cousin        paternal    Allergies  Allergen Reactions   Penicillin G Rash and Other (See Comments)    Has patient had a PCN reaction causing immediate rash, facial/tongue/throat swelling, SOB or lightheadedness with hypotension: Yes Has patient had a PCN reaction causing severe rash involving mucus membranes or skin necrosis: no Has patient had a PCN reaction that required hospitalization no Has patient had a PCN reaction occurring within the last 10 years: no If all of the above answers are NO, then may proceed with Cephalosporin use.    Penicillins Rash    Review  of Systems  All other systems reviewed and are negative.      Objective:   BP 134/84   Pulse (!) 52   Ht 5' 8 (1.727 m)   Wt 215 lb 3.2 oz (97.6 kg)   SpO2 99%   BMI 32.72 kg/m   Vitals:   09/19/23 1323  BP: 134/84  Pulse: (!) 52  Height: 5' 8 (1.727 m)  Weight: 215 lb 3.2 oz (97.6 kg)  SpO2: 99%  BMI (Calculated): 32.73    Physical Exam Vitals and nursing note reviewed.  Constitutional:      Appearance: Normal appearance. She is normal weight.  HENT:     Head: Normocephalic.  Eyes:     Extraocular Movements: Extraocular movements intact.     Conjunctiva/sclera: Conjunctivae normal.     Pupils: Pupils are equal, round, and reactive to light.  Cardiovascular:     Rate and Rhythm:  Normal rate.  Pulmonary:     Effort: Pulmonary effort is normal.  Neurological:     General: No focal deficit present.     Mental Status: She is alert and oriented to person, place, and time. Mental status is at baseline.  Psychiatric:        Mood and Affect: Mood normal.        Behavior: Behavior normal.        Thought Content: Thought content normal.        Judgment: Judgment normal.      No results found for any visits on 09/19/23.  No results found for this or any previous visit (from the past 2160 hours).     Assessment & Plan Generalized abdominal pain Abnormal findings on diagnostic imaging of gallbladder Sending refills for medications. Will also set pt up for GI referral.      Return in about 2 months (around 11/19/2023).   Total time spent: 20 minutes  ALAN CHRISTELLA ARRANT, FNP  09/19/2023   This document may have been prepared by [facility] Voice Recognition software and as such may include unintentional dictation errors.

## 2023-10-10 ENCOUNTER — Other Ambulatory Visit: Payer: Self-pay | Admitting: Family

## 2023-10-24 ENCOUNTER — Other Ambulatory Visit: Payer: Self-pay | Admitting: Family

## 2023-11-21 ENCOUNTER — Ambulatory Visit: Admitting: Family

## 2023-12-01 ENCOUNTER — Other Ambulatory Visit: Payer: Self-pay | Admitting: Family

## 2023-12-12 DIAGNOSIS — R1084 Generalized abdominal pain: Secondary | ICD-10-CM | POA: Diagnosis not present

## 2023-12-12 DIAGNOSIS — R194 Change in bowel habit: Secondary | ICD-10-CM | POA: Diagnosis not present

## 2023-12-12 DIAGNOSIS — K824 Cholesterolosis of gallbladder: Secondary | ICD-10-CM | POA: Diagnosis not present

## 2023-12-12 DIAGNOSIS — K6289 Other specified diseases of anus and rectum: Secondary | ICD-10-CM | POA: Diagnosis not present

## 2023-12-17 ENCOUNTER — Encounter: Payer: Self-pay | Admitting: Family

## 2023-12-19 ENCOUNTER — Encounter: Payer: Self-pay | Admitting: Internal Medicine

## 2023-12-26 ENCOUNTER — Ambulatory Visit: Admitting: Anesthesiology

## 2023-12-26 ENCOUNTER — Ambulatory Visit
Admission: RE | Admit: 2023-12-26 | Discharge: 2023-12-26 | Disposition: A | Discharge status: Home / Self Care | Attending: Internal Medicine | Admitting: Internal Medicine

## 2023-12-26 ENCOUNTER — Encounter: Payer: Self-pay | Admitting: Internal Medicine

## 2023-12-26 ENCOUNTER — Encounter
Admission: RE | Disposition: A | Discharge status: Home / Self Care | Payer: Self-pay | Source: Home / Self Care | Attending: Internal Medicine

## 2023-12-26 DIAGNOSIS — Q438 Other specified congenital malformations of intestine: Secondary | ICD-10-CM | POA: Diagnosis not present

## 2023-12-26 DIAGNOSIS — Z6832 Body mass index (BMI) 32.0-32.9, adult: Secondary | ICD-10-CM | POA: Insufficient documentation

## 2023-12-26 DIAGNOSIS — K2970 Gastritis, unspecified, without bleeding: Secondary | ICD-10-CM | POA: Diagnosis not present

## 2023-12-26 DIAGNOSIS — R194 Change in bowel habit: Secondary | ICD-10-CM | POA: Diagnosis not present

## 2023-12-26 DIAGNOSIS — I4891 Unspecified atrial fibrillation: Secondary | ICD-10-CM | POA: Insufficient documentation

## 2023-12-26 DIAGNOSIS — K648 Other hemorrhoids: Secondary | ICD-10-CM | POA: Diagnosis not present

## 2023-12-26 DIAGNOSIS — M1990 Unspecified osteoarthritis, unspecified site: Secondary | ICD-10-CM | POA: Diagnosis not present

## 2023-12-26 DIAGNOSIS — I10 Essential (primary) hypertension: Secondary | ICD-10-CM | POA: Diagnosis not present

## 2023-12-26 DIAGNOSIS — E669 Obesity, unspecified: Secondary | ICD-10-CM | POA: Insufficient documentation

## 2023-12-26 DIAGNOSIS — R1084 Generalized abdominal pain: Secondary | ICD-10-CM | POA: Diagnosis not present

## 2023-12-26 DIAGNOSIS — K449 Diaphragmatic hernia without obstruction or gangrene: Secondary | ICD-10-CM | POA: Insufficient documentation

## 2023-12-26 DIAGNOSIS — K3189 Other diseases of stomach and duodenum: Secondary | ICD-10-CM | POA: Diagnosis not present

## 2023-12-26 DIAGNOSIS — Z7984 Long term (current) use of oral hypoglycemic drugs: Secondary | ICD-10-CM | POA: Diagnosis not present

## 2023-12-26 DIAGNOSIS — Z79899 Other long term (current) drug therapy: Secondary | ICD-10-CM | POA: Diagnosis not present

## 2023-12-26 DIAGNOSIS — K6289 Other specified diseases of anus and rectum: Secondary | ICD-10-CM | POA: Insufficient documentation

## 2023-12-26 DIAGNOSIS — K640 First degree hemorrhoids: Secondary | ICD-10-CM | POA: Insufficient documentation

## 2023-12-26 HISTORY — DX: Diaphragmatic hernia without obstruction or gangrene: K44.9

## 2023-12-26 HISTORY — PX: COLONOSCOPY: SHX5424

## 2023-12-26 HISTORY — PX: ESOPHAGOGASTRODUODENOSCOPY: SHX5428

## 2023-12-26 SURGERY — COLONOSCOPY
Anesthesia: General

## 2023-12-26 MED ORDER — LIDOCAINE HCL (CARDIAC) PF 100 MG/5ML IV SOSY
PREFILLED_SYRINGE | INTRAVENOUS | Status: DC | PRN
  Administered 2023-12-26: 80 mg via INTRAVENOUS

## 2023-12-26 MED ORDER — PROPOFOL 500 MG/50ML IV EMUL
INTRAVENOUS | Status: DC | PRN
  Administered 2023-12-26: 75 ug/kg/min via INTRAVENOUS

## 2023-12-26 MED ORDER — DEXMEDETOMIDINE HCL IN NACL 80 MCG/20ML IV SOLN
INTRAVENOUS | Status: DC | PRN
  Administered 2023-12-26: 12 ug via INTRAVENOUS
  Administered 2023-12-26: 8 ug via INTRAVENOUS

## 2023-12-26 MED ORDER — SODIUM CHLORIDE 0.9 % IV SOLN: INTRAVENOUS | Status: DC

## 2023-12-26 MED ORDER — GLYCOPYRROLATE 0.2 MG/ML IJ SOLN
INTRAMUSCULAR | Status: DC | PRN
  Administered 2023-12-26: .2 mg via INTRAVENOUS

## 2023-12-26 MED ORDER — PROPOFOL 10 MG/ML IV BOLUS
INTRAVENOUS | Status: DC | PRN
  Administered 2023-12-26 (×2): 50 mg via INTRAVENOUS

## 2023-12-26 NOTE — Op Note (Signed)
 [facility] Gastroenterology Patient Name: Sandra Watts Procedure Date: 12/26/2023 11:12 AM MRN: [id number] Account #: [id number] Date of Birth: [birth year] Admit Type: Outpatient Age: 73 Room: [facility] ENDO ROOM 3 Gender: Female Note Status: Finalized Instrument Name: Upper Endoscope [id number] Procedure:             Upper GI endoscopy Indications:           Generalized abdominal pain Providers:             Jamare Vanatta K. Peighton Mehra MD, MD Medicines:             Propofol  per Anesthesia Complications:         No immediate complications. Estimated blood loss:                         Minimal. Procedure:             Pre-Anesthesia Assessment:                        - The risks and benefits of the procedure and the                         sedation options and risks were discussed with the                         patient. All questions were answered and informed                         consent was obtained.                        - Patient identification and proposed procedure were                         verified prior to the procedure by the nurse. The                         procedure was verified in the procedure room.                        - ASA Grade Assessment: III - A patient with severe                         systemic disease.                        - After reviewing the risks and benefits, the patient                         was deemed in satisfactory condition to undergo the                         procedure.                        After obtaining informed consent, the endoscope was                         passed under direct vision. Throughout the procedure,  the patient's blood pressure, pulse, and oxygen                         saturations were monitored continuously. The Endoscope                         was introduced through the mouth, and advanced to the                         third part of duodenum. The upper GI endoscopy was                          accomplished without difficulty. The patient tolerated                         the procedure well. Findings:      The esophagus was normal.      A 3 cm hiatal hernia was present.      Patchy minimal inflammation characterized by erythema was found in the       gastric body. Biopsies were taken with a cold forceps for Helicobacter       pylori testing.      The exam of the stomach was otherwise normal.      The examined duodenum was normal. Impression:            - Normal esophagus.                        - 3 cm hiatal hernia.                        - Gastritis. Biopsied.                        - Normal examined duodenum. Recommendation:        - Await pathology results.                        - Proceed with colonoscopy Procedure Code(s):     --- Professional ---                        [phone], Esophagogastroduodenoscopy, flexible,                         transoral; with biopsy, single or multiple Diagnosis Code(s):     --- Professional ---                        R10.84, Generalized abdominal pain                        K29.70, Gastritis, unspecified, without bleeding                        K44.9, Diaphragmatic hernia without obstruction or                         gangrene CPT copyright 2022 American Medical Association. All rights reserved. The codes documented in this report are preliminary and upon coder review may  be revised to meet current compliance requirements. Ladell MARLA Boss MD, MD 12/26/2023  11:26:01 AM This report has been signed electronically. Number of Addenda: 0 Note Initiated On: 12/26/2023 11:12 AM Estimated Blood Loss:  Estimated blood loss was minimal.      [facility]

## 2023-12-26 NOTE — Transfer of Care (Signed)
 Immediate Anesthesia Transfer of Care Note  Patient: Sandra Watts  Procedure(s) Performed: COLONOSCOPY EGD (ESOPHAGOGASTRODUODENOSCOPY)  Patient Location: PACU  Anesthesia Type:General  Level of Consciousness: sedated  Airway & Oxygen Therapy: Patient Spontanous Breathing  Post-op Assessment: Report given to RN and Post -op Vital signs reviewed and stable  Post vital signs: Reviewed and stable  Last Vitals:  Vitals Value Taken Time  BP 98/62 12/26/23 11:47  Temp    Pulse 64 12/26/23 11:47  Resp 23 12/26/23 11:47  SpO2 95 % 12/26/23 11:47  Vitals shown include unfiled device data.  Last Pain:  Vitals:   12/26/23 1022  TempSrc: Temporal  PainSc: 0-No pain         Complications: No notable events documented.

## 2023-12-26 NOTE — Anesthesia Postprocedure Evaluation (Signed)
 Anesthesia Post Note  Patient: Sandra Watts  Procedure(s) Performed: COLONOSCOPY EGD (ESOPHAGOGASTRODUODENOSCOPY)  Patient location during evaluation: PACU Anesthesia Type: General Level of consciousness: awake and alert, oriented and patient cooperative Pain management: pain level controlled Vital Signs Assessment: post-procedure vital signs reviewed and stable Respiratory status: spontaneous breathing, nonlabored ventilation and respiratory function stable Cardiovascular status: blood pressure returned to baseline and stable Postop Assessment: adequate PO intake Anesthetic complications: no   No notable events documented.   Last Vitals:  Vitals:   12/26/23 1200 12/26/23 1202  BP: 113/72   Pulse:    Resp: 17 (!) 23  Temp:    SpO2:      Last Pain:  Vitals:   12/26/23 1146  TempSrc: Temporal  PainSc:                  Alfonso Ruths

## 2023-12-26 NOTE — Interval H&P Note (Signed)
 History and Physical Interval Note:  12/26/2023 11:05 AM  Sandra Watts  has presented today for surgery, with the diagnosis of Abdominal pain, generalized [R10.84]  Change in bowel habits [R19.4] Rectal pain [K62.89].  The various methods of treatment have been discussed with the patient and family. After consideration of risks, benefits and other options for treatment, the patient has consented to  Procedure(s): COLONOSCOPY (N/A) EGD (ESOPHAGOGASTRODUODENOSCOPY) (N/A) as a surgical intervention.  The patient's history has been reviewed, patient examined, no change in status, stable for surgery.  I have reviewed the patient's chart and labs.  Questions were answered to the patient's satisfaction.     [place], Deitrich Steve

## 2023-12-26 NOTE — Anesthesia Preprocedure Evaluation (Addendum)
 Anesthesia Evaluation  Patient identified by MRN, date of birth, ID band Patient awake    Reviewed: Allergy & Precautions, NPO status , Patient's Chart, lab work & pertinent test results  History of Anesthesia Complications Negative for: history of anesthetic complications  Airway Mallampati: IV   Neck ROM: Full    Dental  (+) Edentulous Upper, Missing   Pulmonary neg pulmonary ROS   Pulmonary exam normal breath sounds clear to auscultation       Cardiovascular hypertension, Normal cardiovascular exam+ dysrhythmias (a fib)  Rhythm:Regular Rate:Normal     Neuro/Psych negative neurological ROS     GI/Hepatic hiatal hernia,,,  Endo/Other  Obesity   Renal/GU negative Renal ROS     Musculoskeletal  (+) Arthritis ,    Abdominal   Peds  Hematology negative hematology ROS (+)   Anesthesia Other Findings   Reproductive/Obstetrics                              Anesthesia Physical Anesthesia Plan  ASA: 2  Anesthesia Plan: General   Post-op Pain Management:    Induction: Intravenous  PONV Risk Score and Plan: 3 and Propofol  infusion, TIVA and Treatment may vary due to age or medical condition  Airway Management Planned: Natural Airway  Additional Equipment:   Intra-op Plan:   Post-operative Plan:   Informed Consent: I have reviewed the patients History and Physical, chart, labs and discussed the procedure including the risks, benefits and alternatives for the proposed anesthesia with the patient or authorized representative who has indicated his/her understanding and acceptance.       Plan Discussed with: CRNA  Anesthesia Plan Comments: (LMA/GETA backup discussed.  Patient consented for risks of anesthesia including but not limited to:  - adverse reactions to medications - damage to eyes, teeth, lips or other oral mucosa - nerve damage due to positioning  - sore throat or  hoarseness - damage to heart, brain, nerves, lungs, other parts of body or loss of life  Informed patient about role of CRNA in peri- and intra-operative care.  Patient voiced understanding.)         Anesthesia Quick Evaluation

## 2023-12-26 NOTE — H&P (Signed)
 Outpatient short stay form Pre-procedure 12/26/2023 11:01 AM Guilherme Schwenke K. Aundria, M.D.  Primary Physician: Alan Arrant, FNP  Reason for visit:    Diagnoses  Abdominal pain, generalized  Change in bowel habits  Rectal pain      History of present illness:  Ms. Putnam presents to the [facility] GI clinic at the request of Alan Arrant, NP, at [facility] for chief complaint of generalized abdominal pain, incidental finding of 2 mm gallbladder polyp, and change in bowel habits. She presents to the clinic by herself. She reports she was told she had a gallbladder polyp and needed to come see GI. She had abdominal ultrasound performed in March 2025 for indications of upper abdominal pain which revealed a 2 mm gallbladder polyp without evidence of gallstones or gallbladder inflammatory changes. There was normal diameter of the common bile duct and evidence of fatty liver changes. She has had some issues with generalized abdominal pain over the last 1-year. Pain is hard for her to describe. Some days she hurts in the RUQ and other days it is around her umbilicus and other times it is in her LLQ or bilateral lower abdominal area. No clear pattern to her abdominal pain. She reports food seems to sometimes make her pain more noticeable and then other times food seems to make it a little better. She denies any nausea, vomiting, dysphagia, odynophagia, early satiety, hoarseness, or loss of appetite. She has also noticed a change in her stools over the past 3 months. She reports stools are excessively large in diameter and difficult to pass. She has had some rectal pain upon defecation due to the large diameter of the stool. She reports stools are about 3-4 times the length than she is used to. She denies any hematochezia or melena. She has dropped 20-30-lbs with healthier dietary habits and increased physical activity. She has been drinking 70+ ounces of water daily. Last colonoscopy was in 2009 and  was normal. She was seen by open access 5 years ago and scheduled for screening colonoscopy, but never followed up to get one. She is concerned she might have cancer and wants to get a colonoscopy scheduled.     Current Facility-Administered Medications:    0.9 %  sodium chloride  infusion, , Intravenous, Continuous, [place], Anthonee Gelin K, MD, Last Rate: 20 mL/hr at 12/26/23 1055, Continued from Pre-op at 12/26/23 1055  Medications Prior to Admission  Medication Sig Dispense Refill Last Dose/Taking   cetirizine  (EQ ALLERGY RELIEF, CETIRIZINE ,) 10 MG tablet Take 1 tablet (10 mg total) by mouth daily. 90 tablet 3 12/26/2023 Morning   lisinopril  (ZESTRIL ) 10 MG tablet Take 1 tablet (10 mg total) by mouth daily. 90 tablet 0 12/26/2023 Morning   metoprolol  tartrate (LOPRESSOR ) 25 MG tablet Take 1 tablet by mouth twice daily 180 tablet 0 12/26/2023 Morning   omeprazole  (PRILOSEC ) 20 MG capsule Take 2 capsules by mouth once daily 180 capsule 3 12/26/2023 Morning   atorvastatin  (LIPITOR) 40 MG tablet Take 1 tablet (40 mg total) by mouth daily. 90 tablet 0    Cholecalciferol  1.25 MG (50000 UT) capsule Take 1 capsule (50,000 Units total) by mouth once a week. 12 capsule 3    cyclobenzaprine  (FLEXERIL ) 5 MG tablet TAKE 1 TABLET BY MOUTH TWICE DAILY AS NEEDED FOR MUSCLE SPASM 180 tablet 0    FARXIGA  10 MG TABS tablet Take 1 tablet (10 mg total) by mouth daily. 90 tablet 0    ferrous sulfate 325 (65 FE) MG EC tablet  Take 325 mg by mouth 3 (three) times daily with meals.   12/19/2023   fluticasone  (FLONASE ) 50 MCG/ACT nasal spray Use 1 spray(s) in each nostril twice daily 48 g 0    meclizine  (ANTIVERT ) 25 MG tablet TAKE 1 TABLET BY MOUTH THREE TIMES DAILY AS NEEDED FOR DIZZINESS 90 tablet 0    sucralfate  (CARAFATE ) 1 GM/10ML suspension TAKE 10 ML BY MOUTH  IN THE MORNING, AT NOON AND AT BEDTIME FOR 10 DAYS 300 mL 0      Allergies  Allergen Reactions   Penicillin G Rash and Other (See Comments)    Has patient had  a PCN reaction causing immediate rash, facial/tongue/throat swelling, SOB or lightheadedness with hypotension: Yes Has patient had a PCN reaction causing severe rash involving mucus membranes or skin necrosis: no Has patient had a PCN reaction that required hospitalization no Has patient had a PCN reaction occurring within the last 10 years: no If all of the above answers are NO, then may proceed with Cephalosporin use.    Penicillins Rash     Past Medical History:  Diagnosis Date   Arthritis    Hiatal hernia    Hypertension    Musculoskeletal chest pain    Shortness of breath 06/16/2014    Review of systems:  Otherwise negative.    Physical Exam  Gen: Alert, oriented. Appears stated age.  HEENT: [place]/AT. PERRLA. Lungs: CTA, no wheezes. CV: RR nl S1, S2. Abd: soft, benign, no masses. BS+ Ext: No edema. Pulses 2+    Planned procedures:Recommend EGD and colonoscopy for endoluminal evaluation for above indications  - If EGD and colonoscopy are nondiagnostic, consider HIDA scan - Reassurance provided surrounding gallbladder polyp. No indications for CCY.  - Further recs after procedures   Proceed with EGD and colonoscopy. The patient understands the nature of the planned procedure, indications, risks, alternatives and potential complications including but not limited to bleeding, infection, perforation, damage to internal organs and possible oversedation/side effects from anesthesia. The patient agrees and gives consent to proceed.  Please refer to procedure notes for findings, recommendations and patient disposition/instructions.     Ariyonna Twichell K. Aundria, M.D. Gastroenterology 12/26/2023  11:01 AM

## 2023-12-26 NOTE — Op Note (Signed)
 [facility] Patient Name: Sandra Watts Procedure Date: 12/26/2023 11:11 AM MRN: [id number] Account #: [id number] Date of Birth: 1951-01-21 Admit Type: Outpatient Age: 73 Room: [facility] ENDO ROOM 3 Gender: Female Note Status: Supervisor Override Instrument Name: [id number] Procedure:             Colonoscopy Indications:           Change in bowel habits, Rectal pain Providers:             Shelie Lansing K. Jonerik Sliker MD, MD Medicines:             Propofol  per Anesthesia Complications:         No immediate complications. Procedure:             Pre-Anesthesia Assessment:                        - The risks and benefits of the procedure and the                         sedation options and risks were discussed with the                         patient. All questions were answered and informed                         consent was obtained.                        - Patient identification and proposed procedure were                         verified prior to the procedure by the nurse. The                         procedure was verified in the procedure room.                        - ASA Grade Assessment: III - A patient with severe                         systemic disease.                        - After reviewing the risks and benefits, the patient                         was deemed in satisfactory condition to undergo the                         procedure.                        After obtaining informed consent, the colonoscope was                         passed under direct vision. Throughout the procedure,                         the patient's blood pressure, pulse, and oxygen  saturations were monitored continuously. The                         Colonoscope was introduced through the anus and                         advanced to the the cecum, identified by appendiceal                         orifice and ileocecal valve. The colonoscopy was                          technically difficult and complex due to a redundant                         colon, significant looping and a tortuous colon.                         Successful completion of the procedure was aided by                         withdrawing and reinserting the scope. Findings:      The perianal and digital rectal examinations were normal. Pertinent       negatives include normal sphincter tone and no palpable rectal lesions.      Non-bleeding internal hemorrhoids were found during retroflexion. The       hemorrhoids were Grade I (internal hemorrhoids that do not prolapse).      The colon (entire examined portion) was moderately redundant.      No other significant abnormalities were identified in a careful       examination of the remainder of the colon. Impression:            - Non-bleeding internal hemorrhoids.                        - Redundant colon.                        - No specimens collected. Recommendation:        - Await pathology results from EGD, also performed                         today.                        - Patient has a contact number available for                         emergencies. The signs and symptoms of potential                         delayed complications were discussed with the patient.                         Return to normal activities tomorrow. Written                         discharge instructions were provided to the patient.                        -  Resume previous diet.                        - Continue present medications.                        - You do NOT require further colon cancer screening                         measures (Annual stool testing (i.e. hemoccult, FIT,                         cologuard), sigmoidoscopy, colonoscopy or CT                         colonography). You should share this recommendation                         with your Primary Care provider.                        - Return to GI office PRN.                         - The findings and recommendations were discussed with                         the patient. Procedure Code(s):     --- Professional ---                        [phone], Colonoscopy, flexible; diagnostic, including                         collection of specimen(s) by brushing or washing, when                         performed (separate procedure) Diagnosis Code(s):     --- Professional ---                        Q43.8, Other specified congenital malformations of                         intestine                        K64.0, First degree hemorrhoids CPT copyright 2022 American [employer]. All rights reserved. The codes documented in this report are preliminary and upon coder review may  be revised to meet current compliance requirements. Ladell MARLA Boss MD, MD 12/26/2023 11:49:30 AM This report has been signed electronically. Number of Addenda: 0 Note Initiated On: 12/26/2023 11:11 AM Scope Withdrawal Time: 0 hours 6 minutes 0 seconds  Total Procedure Duration: 0 hours 15 minutes 35 seconds  Estimated Blood Loss:  Estimated blood loss: none.      [facility]

## 2023-12-27 ENCOUNTER — Encounter: Payer: Self-pay | Admitting: Internal Medicine

## 2023-12-27 LAB — SURGICAL PATHOLOGY

## 2023-12-31 ENCOUNTER — Other Ambulatory Visit: Payer: Self-pay | Admitting: Family

## 2024-01-03 ENCOUNTER — Other Ambulatory Visit: Payer: Self-pay | Admitting: Family

## 2024-01-06 ENCOUNTER — Other Ambulatory Visit: Payer: Self-pay | Admitting: Family

## 2024-01-08 ENCOUNTER — Other Ambulatory Visit: Payer: Self-pay | Admitting: Family

## 2024-01-23 ENCOUNTER — Other Ambulatory Visit: Payer: Self-pay | Admitting: Family

## 2024-01-23 DIAGNOSIS — Z1231 Encounter for screening mammogram for malignant neoplasm of breast: Secondary | ICD-10-CM

## 2024-01-30 DIAGNOSIS — L600 Ingrowing nail: Secondary | ICD-10-CM | POA: Diagnosis not present

## 2024-01-30 DIAGNOSIS — M2042 Other hammer toe(s) (acquired), left foot: Secondary | ICD-10-CM | POA: Diagnosis not present

## 2024-01-30 DIAGNOSIS — M898X9 Other specified disorders of bone, unspecified site: Secondary | ICD-10-CM | POA: Diagnosis not present

## 2024-01-30 DIAGNOSIS — M79674 Pain in right toe(s): Secondary | ICD-10-CM | POA: Diagnosis not present

## 2024-01-30 DIAGNOSIS — B351 Tinea unguium: Secondary | ICD-10-CM | POA: Diagnosis not present

## 2024-01-30 DIAGNOSIS — M79675 Pain in left toe(s): Secondary | ICD-10-CM | POA: Diagnosis not present

## 2024-01-30 DIAGNOSIS — M1710 Unilateral primary osteoarthritis, unspecified knee: Secondary | ICD-10-CM | POA: Insufficient documentation

## 2024-01-30 DIAGNOSIS — M2041 Other hammer toe(s) (acquired), right foot: Secondary | ICD-10-CM | POA: Diagnosis not present

## 2024-02-13 ENCOUNTER — Ambulatory Visit
Admission: RE | Admit: 2024-02-13 | Discharge: 2024-02-13 | Disposition: A | Discharge status: Home / Self Care | Source: Ambulatory Visit | Attending: Family | Admitting: Family

## 2024-02-13 DIAGNOSIS — Z1231 Encounter for screening mammogram for malignant neoplasm of breast: Secondary | ICD-10-CM | POA: Diagnosis not present

## 2024-02-26 ENCOUNTER — Encounter: Payer: Self-pay | Admitting: *Deleted

## 2024-02-26 NOTE — Progress Notes (Signed)
 Sandra Watts                                          MRN: [id number]   02/26/2024   The [facility] Quality Team Specialist reviewed this patient medical record for the purposes of chart review for care gap closure. The following were reviewed: chart review for care gap closure-diabetic eye exam and kidney health evaluation for diabetes:eGFR  and uACR.    [facility] Quality Team

## 2024-03-09 ENCOUNTER — Other Ambulatory Visit: Payer: Self-pay | Admitting: Family

## 2024-03-12 ENCOUNTER — Other Ambulatory Visit: Payer: Self-pay

## 2024-03-12 MED ORDER — METOPROLOL TARTRATE 25 MG PO TABS: 25.0000 mg | ORAL_TABLET | Freq: Two times a day (BID) | ORAL | 0 refills | Status: DC

## 2024-04-05 ENCOUNTER — Other Ambulatory Visit: Payer: Self-pay | Admitting: Family

## 2024-04-18 ENCOUNTER — Other Ambulatory Visit: Payer: Self-pay | Admitting: Family

## 2024-04-29 ENCOUNTER — Other Ambulatory Visit: Payer: Self-pay | Admitting: Family

## 2024-05-08 ENCOUNTER — Ambulatory Visit: Payer: Self-pay | Admitting: Family

## 2024-05-08 ENCOUNTER — Ambulatory Visit: Admitting: Family

## 2024-05-08 ENCOUNTER — Encounter: Payer: Self-pay | Admitting: Family

## 2024-05-08 VITALS — BP 114/78 | HR 71 | Ht 68.0 in | Wt 220.2 lb

## 2024-05-08 DIAGNOSIS — R112 Nausea with vomiting, unspecified: Secondary | ICD-10-CM | POA: Insufficient documentation

## 2024-05-08 DIAGNOSIS — E1159 Type 2 diabetes mellitus with other circulatory complications: Secondary | ICD-10-CM | POA: Diagnosis not present

## 2024-05-08 DIAGNOSIS — R5383 Other fatigue: Secondary | ICD-10-CM | POA: Insufficient documentation

## 2024-05-08 DIAGNOSIS — E782 Mixed hyperlipidemia: Secondary | ICD-10-CM | POA: Diagnosis not present

## 2024-05-08 DIAGNOSIS — K2100 Gastro-esophageal reflux disease with esophagitis, without bleeding: Secondary | ICD-10-CM | POA: Diagnosis not present

## 2024-05-08 DIAGNOSIS — Z13820 Encounter for screening for osteoporosis: Secondary | ICD-10-CM | POA: Diagnosis not present

## 2024-05-08 DIAGNOSIS — E1165 Type 2 diabetes mellitus with hyperglycemia: Secondary | ICD-10-CM

## 2024-05-08 DIAGNOSIS — E538 Deficiency of other specified B group vitamins: Secondary | ICD-10-CM | POA: Diagnosis not present

## 2024-05-08 DIAGNOSIS — E559 Vitamin D deficiency, unspecified: Secondary | ICD-10-CM

## 2024-05-08 DIAGNOSIS — R1084 Generalized abdominal pain: Secondary | ICD-10-CM | POA: Insufficient documentation

## 2024-05-08 DIAGNOSIS — E1169 Type 2 diabetes mellitus with other specified complication: Secondary | ICD-10-CM | POA: Diagnosis not present

## 2024-05-08 DIAGNOSIS — I152 Hypertension secondary to endocrine disorders: Secondary | ICD-10-CM

## 2024-05-08 DIAGNOSIS — E66811 Obesity, class 1: Secondary | ICD-10-CM | POA: Insufficient documentation

## 2024-05-08 DIAGNOSIS — M5431 Sciatica, right side: Secondary | ICD-10-CM | POA: Insufficient documentation

## 2024-05-08 LAB — POC CREATINE & ALBUMIN,URINE
Creatinine, POC: 50 mg/dL
Microalbumin Ur, POC: 80 mg/L

## 2024-05-08 MED ORDER — METOPROLOL TARTRATE 25 MG PO TABS: 25.0000 mg | ORAL_TABLET | Freq: Two times a day (BID) | ORAL | 0 refills | Status: AC

## 2024-05-08 MED ORDER — ONDANSETRON 4 MG PO TBDP: 4.0000 mg | ORAL_TABLET | Freq: Three times a day (TID) | ORAL | 0 refills | Status: AC | PRN

## 2024-05-08 MED ORDER — SUCRALFATE 1 GM/10ML PO SUSP: 1.0000 g | Freq: Three times a day (TID) | ORAL | 0 refills | Status: DC

## 2024-05-08 NOTE — Assessment & Plan Note (Signed)
-   Continue healthy diet and exercise as tolerated. - Continue medications as prescribed. - Check labs when patient is fasting

## 2024-05-08 NOTE — Assessment & Plan Note (Signed)
-   DEXA scan orderd

## 2024-05-08 NOTE — Assessment & Plan Note (Signed)
-   Check labs in future when patient is fasting - Supplementation recommended based off lab results and will notify patient at that time

## 2024-05-08 NOTE — Assessment & Plan Note (Signed)
-   Continue medications as prescribed. - PRN Zofran  every 8 hours as needed for nausea/vomiting - Reinforced avoiding dietary triggers.

## 2024-05-08 NOTE — Assessment & Plan Note (Signed)
-   Check routine blood work when patient returns fasting

## 2024-05-08 NOTE — Assessment & Plan Note (Signed)
-   Lumbar and right hip xray ordered. - Continue heat and topical pain relief as needed.

## 2024-05-08 NOTE — Progress Notes (Signed)
 Established Patient Office Visit  Subjective:  Patient ID: Sandra Watts, female    DOB: [birth year]  Age: 73 y.o. MRN: [id number]  Chief Complaint  Patient presents with   Follow-up    Medication refills    Patient is here today for her 6 months follow up.  She has been feeling fairly well since last appointment.   She does have additional concerns to discuss today. Patient reports continued RUQ pain that is worse after eating. She needs refills on her carafate  today. Reinforced bland diet and avoiding dietary triggers.  She reports falling 3 times since her last appointment. She reports they all happened in 08/2023. She denies hurting anything at that time. She reports burning stinging sensation that travels down her right leg and stops behind her right knee. She reports using icey hot every night and taking tylenol  as needed. She reports the days she fell her thigh had felt numb. Will order lumbar and right hip xray. Continue conservative therapy at this time as patient reports not having any symptoms at this time.  She also reports throwing up frequently after eating discounted cookies from her work. She reports she hasn't thrown up since last night. She has no change in her appetite. Denies fever, chills, changes to her bowel habits.  Labs are due today. Patient is not fasting and will return to get them completed. She needs refills. DEXA scan ordered.  I have reviewed her active problem list, medication list, allergies, family history, social history, health maintenance, notes from last encounter, lab results for her appointment today.      No other concerns at this time.   Past Medical History:  Diagnosis Date   Arthritis    Hiatal hernia    Hypertension    Musculoskeletal chest pain    Shortness of breath 06/16/2014    Past Surgical History:  Procedure Laterality Date   APPENDECTOMY     CARDIAC SURGERY     ablation   COLONOSCOPY N/A 12/26/2023   Procedure:  COLONOSCOPY;  Surgeon: Toledo, Ladell POUR, MD;  Location: [facility] END[facility]COPY;  Service: Gastroenterology;  Laterality: N/A;   ESOPHAGOGASTRODUODENOSCOPY N/A 12/26/2023   Procedure: EGD (ESOPHAGOGASTRODUODENOSCOPY);  Surgeon: Toledo, Ladell POUR, MD;  Location: [facility] ENDOSCOPY;  Service: Gastroenterology;  Laterality: N/A;   REPLACEMENT TOTAL KNEE Bilateral     Social History   Socioeconomic History   Marital status: Single    Spouse name: Not on file   Number of children: Not on file   Years of education: Not on file   Highest education level: Not on file  Occupational History   Not on file  Tobacco Use   Smoking status: Never   Smokeless tobacco: Never  Vaping Use   Vaping status: Never Used  Substance and Sexual Activity   Alcohol use: Not Currently   Drug use: Never   Sexual activity: Not on file  Other Topics Concern   Not on file  Social History Narrative   Not on file   Social Drivers of Health   Financial Resource Strain: Patient Declined (12/12/2023)   Received from [facility]   Overall Financial Resource Strain (CARDIA)    Difficulty of Paying Living Expenses: Patient declined  Food Insecurity: Patient Declined (12/12/2023)   Received from [facility]   Hunger Vital Sign    Within the past 12 months, you worried that your food would run out before you got the money to buy more.: Patient declined  Within the past 12 months, the food you bought just didn't last and you didn't have money to get more.: Patient declined  Transportation Needs: Patient Declined (12/12/2023)   Received from [facility] - Transportation    In the past 12 months, has lack of transportation kept you from medical appointments or from getting medications?: Patient declined    Lack of Transportation (Non-Medical): Patient declined  Physical Activity: Not on file  Stress: Not on file  Social Connections: Not on file  Intimate Partner  Violence: Not on file    Family History  Problem Relation Age of Onset   Breast cancer Cousin        paternal    Allergies  Allergen Reactions   Penicillin G Rash and Other (See Comments)    Has patient had a PCN reaction causing immediate rash, facial/tongue/throat swelling, SOB or lightheadedness with hypotension: Yes Has patient had a PCN reaction causing severe rash involving mucus membranes or skin necrosis: no Has patient had a PCN reaction that required hospitalization no Has patient had a PCN reaction occurring within the last 10 years: no If all of the above answers are NO, then may proceed with Cephalosporin use.    Penicillins Rash    Review of Systems  Constitutional:  Negative for malaise/fatigue.  HENT: Negative.    Eyes:  Negative for blurred vision and pain.  Respiratory:  Negative for cough and shortness of breath.   Cardiovascular:  Negative for chest pain, palpitations, claudication and leg swelling.  Gastrointestinal:  Negative for abdominal pain, blood in stool, constipation, diarrhea, nausea and vomiting.  Genitourinary:  Negative for dysuria, frequency and urgency.  Musculoskeletal:  Positive for back pain (right lumbar region that travels down back of right leg).  Skin: Negative.   Neurological:  Positive for sensory change. Negative for dizziness, tingling and headaches.  Endo/Heme/Allergies: Negative.   Psychiatric/Behavioral: Negative.         Objective:   BP 114/78   Pulse 71   Ht 5' 8 (1.727 m)   Wt 220 lb 3.2 oz (99.9 kg)   SpO2 99%   BMI 33.48 kg/m   Vitals:   05/08/24 1309  BP: 114/78  Pulse: 71  Height: 5' 8 (1.727 m)  Weight: 220 lb 3.2 oz (99.9 kg)  SpO2: 99%  BMI (Calculated): 33.49    Physical Exam Vitals and nursing note reviewed.  Constitutional:      Appearance: Normal appearance.  HENT:     Head: Normocephalic.  Eyes:     Extraocular Movements: Extraocular movements intact.     Pupils: Pupils are equal,  round, and reactive to light.  Cardiovascular:     Rate and Rhythm: Normal rate and regular rhythm.     Pulses: Normal pulses.     Heart sounds: Normal heart sounds. No murmur heard. Pulmonary:     Effort: Pulmonary effort is normal. No respiratory distress.     Breath sounds: Normal breath sounds.  Abdominal:     General: There is no distension.     Tenderness: There is no abdominal tenderness.  Musculoskeletal:        General: No tenderness. Normal range of motion.     Cervical back: Normal range of motion and neck supple.     Right lower leg: No edema.     Left lower leg: No edema.  Skin:    General: Skin is warm and dry.     Coloration: Skin is  not jaundiced.     Findings: No erythema.  Neurological:     General: No focal deficit present.     Mental Status: She is alert and oriented to person, place, and time.  Psychiatric:        Mood and Affect: Mood normal.        Speech: Speech normal.        Behavior: Behavior is cooperative.        Cognition and Memory: Memory is not impaired.      No results found for any visits on 05/08/24.  No results found for this or any previous visit (from the past 2160 hours).     Assessment & Plan:   Assessment & Plan Vitamin D  deficiency, unspecified B12 deficiency due to diet - Check labs in future when patient is fasting - Supplementation recommended based off lab results and will notify patient at that time  Generalized abdominal pain Gastroesophageal reflux disease with esophagitis without hemorrhage Nausea and vomiting, unspecified vomiting type - Continue medications as prescribed. - PRN Zofran  every 8 hours as needed for nausea/vomiting - Reinforced avoiding dietary triggers. Combined hyperlipidemia associated with type 2 diabetes mellitus (HCC) Class 1 obesity due to excess calories with serious comorbidity and body mass index (BMI) of 33.0 to 33.9 in adult Type 2 diabetes mellitus with hyperglycemia, without long-term  current use of insulin (HCC) Hypertension associated with diabetes (HCC) - Continue healthy diet and exercise as tolerated. - Continue medications as prescribed. - Check labs when patient is fasting Other fatigue - Check routine blood work when patient returns fasting Encounter for screening for osteoporosis - DEXA scan orderd Right sided sciatica - Lumbar and right hip xray ordered. - Continue heat and topical pain relief as needed.    Return in about 2 weeks (around 05/22/2024).   Total time spent: 25 minutes  Oddis DELENA Cain, FNP  05/08/2024   This document may have been prepared by [facility] Voice Recognition software and as such may include unintentional dictation errors.

## 2024-05-14 ENCOUNTER — Other Ambulatory Visit

## 2024-05-14 NOTE — Progress Notes (Signed)
 Sandra Watts                                          MRN: [id number]   05/14/2024   The [facility]BCI Quality Team Specialist reviewed this patient medical record for the purposes of chart review for care gap closure. The following were reviewed: chart review for care gap closure-kidney health evaluation for diabetes:eGFR  and uACR.    VBCI Quality Team

## 2024-05-15 LAB — CMP14+EGFR
ALT: 20 IU/L (ref 0–32)
AST: 23 IU/L (ref 0–40)
Albumin: 3.9 g/dL (ref 3.8–4.8)
Alkaline Phosphatase: 105 IU/L (ref 49–135)
BUN/Creatinine Ratio: 22 (ref 12–28)
BUN: 25 mg/dL (ref 8–27)
Bilirubin Total: 0.6 mg/dL (ref 0.0–1.2)
CO2: 25 mmol/L (ref 20–29)
Calcium: 9.5 mg/dL (ref 8.7–10.3)
Chloride: 104 mmol/L (ref 96–106)
Creatinine, Ser: 1.13 mg/dL — ABNORMAL HIGH (ref 0.57–1.00)
Globulin, Total: 3.3 g/dL (ref 1.5–4.5)
Glucose: 98 mg/dL (ref 70–99)
Potassium: 4.3 mmol/L (ref 3.5–5.2)
Sodium: 141 mmol/L (ref 134–144)
Total Protein: 7.2 g/dL (ref 6.0–8.5)
eGFR: 52 mL/min/1.73 — ABNORMAL LOW (ref >59)

## 2024-05-15 LAB — CBC WITH DIFFERENTIAL/PLATELET
Basophils Absolute: 0 x10E3/uL (ref 0.0–0.2)
Basos: 1 %
EOS (ABSOLUTE): 0.2 x10E3/uL (ref 0.0–0.4)
Eos: 4 %
Hematocrit: 38.5 % (ref 34.0–46.6)
Hemoglobin: 12.4 g/dL (ref 11.1–15.9)
Immature Grans (Abs): 0 x10E3/uL (ref 0.0–0.1)
Immature Granulocytes: 0 %
Lymphocytes Absolute: 2.3 x10E3/uL (ref 0.7–3.1)
Lymphs: 43 %
MCH: 31.8 pg (ref 26.6–33.0)
MCHC: 32.2 g/dL (ref 31.5–35.7)
MCV: 99 fL — ABNORMAL HIGH (ref 79–97)
Monocytes Absolute: 0.6 x10E3/uL (ref 0.1–0.9)
Monocytes: 11 %
Neutrophils Absolute: 2.1 x10E3/uL (ref 1.4–7.0)
Neutrophils: 41 %
Platelets: 216 x10E3/uL (ref 150–450)
RBC: 3.9 x10E6/uL (ref 3.77–5.28)
RDW: 12.7 % (ref 11.7–15.4)
WBC: 5.2 x10E3/uL (ref 3.4–10.8)

## 2024-05-15 LAB — LIPID PANEL
Chol/HDL Ratio: 2.8 ratio (ref 0.0–4.4)
Cholesterol, Total: 159 mg/dL (ref 100–199)
HDL: 56 mg/dL (ref >39)
LDL Chol Calc (NIH): 87 mg/dL (ref 0–99)
Triglycerides: 84 mg/dL (ref 0–149)
VLDL Cholesterol Cal: 16 mg/dL (ref 5–40)

## 2024-05-15 LAB — HEMOGLOBIN A1C
Est. average glucose Bld gHb Est-mCnc: 134 mg/dL
Hgb A1c MFr Bld: 6.3 % — ABNORMAL HIGH (ref 4.8–5.6)

## 2024-05-15 LAB — TSH: TSH: 1.9 u[IU]/mL (ref 0.450–4.500)

## 2024-05-15 LAB — VITAMIN B12: Vitamin B-12: 986 pg/mL (ref 232–1245)

## 2024-05-15 LAB — VITAMIN D 25 HYDROXY (VIT D DEFICIENCY, FRACTURES): Vit D, 25-Hydroxy: 73.3 ng/mL (ref 30.0–100.0)

## 2024-05-21 ENCOUNTER — Ambulatory Visit
Admission: RE | Admit: 2024-05-21 | Discharge: 2024-05-21 | Disposition: A | Discharge status: Home / Self Care | Source: Ambulatory Visit | Attending: Family | Admitting: Family

## 2024-05-21 ENCOUNTER — Ambulatory Visit
Admission: RE | Admit: 2024-05-21 | Discharge: 2024-05-21 | Disposition: A | Discharge status: Home / Self Care | Attending: Family | Admitting: Family

## 2024-05-21 DIAGNOSIS — M5431 Sciatica, right side: Secondary | ICD-10-CM | POA: Insufficient documentation

## 2024-06-12 ENCOUNTER — Telehealth: Payer: Self-pay | Admitting: Family

## 2024-06-12 NOTE — Telephone Encounter (Signed)
 Patient left VM inquiring about her xray results from weeks ago. She is very upset that she has not yet gotten these results. Please advise on xray results.

## 2024-06-18 ENCOUNTER — Ambulatory Visit: Admitting: Family

## 2024-06-18 ENCOUNTER — Encounter: Payer: Self-pay | Admitting: Family

## 2024-06-18 VITALS — BP 130/80 | HR 64 | Ht 68.0 in | Wt 221.8 lb

## 2024-06-18 DIAGNOSIS — E782 Mixed hyperlipidemia: Secondary | ICD-10-CM

## 2024-06-18 DIAGNOSIS — K2100 Gastro-esophageal reflux disease with esophagitis, without bleeding: Secondary | ICD-10-CM

## 2024-06-18 DIAGNOSIS — M1990 Unspecified osteoarthritis, unspecified site: Secondary | ICD-10-CM

## 2024-06-18 MED ORDER — LIDOCAINE 5 % EX PTCH: 1.0000 | MEDICATED_PATCH | CUTANEOUS | 0 refills | Status: AC

## 2024-06-18 MED ORDER — SUCRALFATE 1 GM/10ML PO SUSP: 1.0000 g | Freq: Three times a day (TID) | ORAL | 0 refills | Status: AC

## 2024-06-18 NOTE — Progress Notes (Unsigned)
 "  Established Patient Office Visit  Subjective:  Patient ID: Sandra Watts, female    DOB: 11-Aug-1950  Age: 74 y.o. MRN: [id number]  Chief Complaint  Patient presents with   Follow-up    Discuss xray results    Patient is here today for her follow up.  She has been feeling about the same since last appointment.   She does have additional concerns to discuss today.  She got her x-ray results and   Labs were done prior to appointment, will discuss in detail today.  She needs refills.   I have reviewed her active problem list, medication list, allergies, health maintenance, notes from last encounter, lab results for her appointment today.      No other concerns at this time.   Past Medical History:  Diagnosis Date   Arthritis    Hiatal hernia    Hypertension    Musculoskeletal chest pain    Shortness of breath 06/16/2014    Past Surgical History:  Procedure Laterality Date   APPENDECTOMY     CARDIAC SURGERY     ablation   COLONOSCOPY N/A 12/26/2023   Procedure: COLONOSCOPY;  Surgeon: Toledo, Ladell POUR, MD;  Location: [facility];  Service: Gastroenterology;  Laterality: N/A;   ESOPHAGOGASTRODUODENOSCOPY N/A 12/26/2023   Procedure: EGD (ESOPHAGOGASTRODUODENOSCOPY);  Surgeon: Toledo, Ladell POUR, MD;  Location: [facility] END[facility];  Service: Gastroenterology;  Laterality: N/A;   REPLACEMENT TOTAL KNEE Bilateral     Social History   Socioeconomic History   Marital status: Single    Spouse name: Not on file   Number of children: Not on file   Years of education: Not on file   Highest education level: Not on file  Occupational History   Not on file  Tobacco Use   Smoking status: Never   Smokeless tobacco: Never  Vaping Use   Vaping status: Never Used  Substance and Sexual Activity   Alcohol use: Not Currently   Drug use: Never   Sexual activity: Not on file  Other Topics Concern   Not on file  Social History Narrative   Not on file   Social Drivers of Health    Tobacco Use: Low Risk (06/18/2024)   Patient History    Smoking Tobacco Use: Never    Smokeless Tobacco Use: Never    Passive Exposure: Not on file  Financial Resource Strain: Patient Declined (12/12/2023)   Received [facility]   Overall Financial Resource Strain (CARDIA)    Difficulty of Paying Living Expenses: Patient declined  Food Insecurity: Patient Declined (12/12/2023)   Received from [facility]   Epic    Within the past 12 months, you worried that your food would run out before you got the money to buy more.: Patient declined    Within the past 12 months, the food you bought just didn't last and you didn't have money to get more.: Patient declined  Transportation Needs: Patient Declined (12/12/2023)   Received from [facility] - Transportation    In the past 12 months, has lack of transportation kept you from medical appointments or from getting medications?: Patient declined    Lack of Transportation (Non-Medical): Patient declined  Physical Activity: Not on file  Stress: Not on file  Social Connections: Not on file  Intimate Partner Violence: Not on file  Depression (PHQ2-9): Medium Risk (08/01/2023)   Depression (PHQ2-9)    PHQ-2 Score: 6  Alcohol Screen: Not  on file  Housing: Patient Declined (12/12/2023)   Received from [facility]   Epic    In the last 12 months, was there a time when you were not able to pay the mortgage or rent on time?: Patient declined    In the past 12 months, how many times have you moved where you were living?: 0    At any time in the past 12 months, were you homeless or living in a shelter (including now)?: Patient declined  Utilities: Patient Declined (12/12/2023)   Received from [facility]   Epic    In the past 12 months has the electric, gas, oil, or water company threatened to shut off services in your home?: Patient declined  Health  Literacy: Not on file    Family History  Problem Relation Age of Onset   Breast cancer Cousin        paternal    Allergies[1]  Review of Systems  All other systems reviewed and are negative.      Objective:   BP 130/80   Pulse 64   Ht 5' 8 (1.727 m)   Wt 221 lb 12.8 oz (100.6 kg)   SpO2 99%   BMI 33.72 kg/m   Vitals:   06/18/24 1331  BP: 130/80  Pulse: 64  Height: 5' 8 (1.727 m)  Weight: 221 lb 12.8 oz (100.6 kg)  SpO2: 99%  BMI (Calculated): 33.73    Physical Exam Vitals and nursing note reviewed.  Constitutional:      Appearance: Normal appearance. She is normal weight.  HENT:     Head: Normocephalic.  Eyes:     Extraocular Movements: Extraocular movements intact.     Conjunctiva/sclera: Conjunctivae normal.     Pupils: Pupils are equal, round, and reactive to light.  Cardiovascular:     Rate and Rhythm: Normal rate and regular rhythm.     Pulses: Normal pulses.     Heart sounds: Normal heart sounds.  Pulmonary:     Effort: Pulmonary effort is normal.  Musculoskeletal:        General: Normal range of motion.     Cervical back: Normal range of motion.  Neurological:     General: No focal deficit present.     Mental Status: She is alert and oriented to person, place, and time. Mental status is at baseline.  Psychiatric:        Mood and Affect: Mood normal.        Behavior: Behavior normal.        Thought Content: Thought content normal.        Judgment: Judgment normal.      No results found for any visits on 06/18/24.  Recent Results (from the past 2160 hours)  POC CREATINE & ALBUMIN,URINE     Status: Abnormal   Collection Time: 05/08/24  2:02 PM  Result Value Ref Range   Microalbumin Ur, POC 80 mg/L   Creatinine, POC 50 mg/dL   Albumin/Creatinine Ratio, Urine, POC 30-300   CMP14+EGFR     Status: Abnormal   Collection Time: 05/14/24 11:22 AM  Result Value Ref Range   Glucose 98 70 - 99 mg/dL   BUN 25 8 - 27 mg/dL   Creatinine, Ser  8.86 (H) 0.57 - 1.00 mg/dL   eGFR 52 (L) >40 fO/fpw/8.26   BUN/Creatinine Ratio 22 12 - 28   Sodium 141 134 - 144 mmol/L   Potassium 4.3 3.5 - 5.2 mmol/L  Chloride 104 96 - 106 mmol/L   CO2 25 20 - 29 mmol/L   Calcium  9.5 8.7 - 10.3 mg/dL   Total Protein 7.2 6.0 - 8.5 g/dL   Albumin 3.9 3.8 - 4.8 g/dL   Globulin, Total 3.3 1.5 - 4.5 g/dL   Bilirubin Total 0.6 0.0 - 1.2 mg/dL   Alkaline Phosphatase 105 49 - 135 IU/L   AST 23 0 - 40 IU/L   ALT 20 0 - 32 IU/L  Lipid panel     Status: None   Collection Time: 05/14/24 11:22 AM  Result Value Ref Range   Cholesterol, Total 159 100 - 199 mg/dL   Triglycerides 84 0 - 149 mg/dL   HDL 56 >60 mg/dL   VLDL Cholesterol Cal 16 5 - 40 mg/dL   LDL Chol Calc (NIH) 87 0 - 99 mg/dL   Chol/HDL Ratio 2.8 0.0 - 4.4 ratio    Comment:                                   T. Chol/HDL Ratio                                             Men  Women                               1/2 Avg.Risk  3.4    3.3                                   Avg.Risk  5.0    4.4                                2X Avg.Risk  9.6    7.1                                3X Avg.Risk 23.4   11.0   VITAMIN D  25 Hydroxy (Vit-D Deficiency, Fractures)     Status: None   Collection Time: 05/14/24 11:22 AM  Result Value Ref Range   Vit D, 25-Hydroxy 73.3 30.0 - 100.0 ng/mL    Comment: Vitamin D  deficiency has been defined by the Institute of Medicine and an Endocrine Society practice guideline as a level of serum 25-OH vitamin D  less than 20 ng/mL (1,2). The Endocrine Society went on to further define vitamin D  insufficiency as a level between 21 and 29 ng/mL (2). 1. IOM (Institute of Medicine). 2010. Dietary reference    intakes for calcium  and D. [place]: The    [employer]. 2. Holick MF, Binkley Harrison, Bischoff-Ferrari HA, et al.    Evaluation, treatment, and prevention of vitamin D     deficiency: an Endocrine Society clinical practice    guideline. JCEM. 2011 Jul;  96(7):1911-30.   Vitamin B12     Status: None   Collection Time: 05/14/24 11:22 AM  Result Value Ref Range   Vitamin B-12 986 232 - 1,245 pg/mL  CBC with Diff     Status: Abnormal   Collection Time: 05/14/24 11:22 AM  Result Value Ref Range   WBC 5.2 3.4 - 10.8 x10E3/uL   RBC 3.90 3.77 - 5.28 x10E6/uL   Hemoglobin 12.4 11.1 - 15.9 g/dL   Hematocrit 61.4 65.9 - 46.6 %   MCV 99 (H) 79 - 97 fL   MCH 31.8 26.6 - 33.0 pg   MCHC 32.2 31.5 - 35.7 g/dL   RDW 87.2 88.2 - 84.5 %   Platelets 216 150 - 450 x10E3/uL   Neutrophils 41 Not Estab. %   Lymphs 43 Not Estab. %   Monocytes 11 Not Estab. %   Eos 4 Not Estab. %   Basos 1 Not Estab. %   Neutrophils Absolute 2.1 1.4 - 7.0 x10E3/uL   Lymphocytes Absolute 2.3 0.7 - 3.1 x10E3/uL   Monocytes Absolute 0.6 0.1 - 0.9 x10E3/uL   EOS (ABSOLUTE) 0.2 0.0 - 0.4 x10E3/uL   Basophils Absolute 0.0 0.0 - 0.2 x10E3/uL   Immature Granulocytes 0 Not Estab. %   Immature Grans (Abs) 0.0 0.0 - 0.1 x10E3/uL  Hemoglobin A1c     Status: Abnormal   Collection Time: 05/14/24 11:22 AM  Result Value Ref Range   Hgb A1c MFr Bld 6.3 (H) 4.8 - 5.6 %    Comment:          Prediabetes: 5.7 - 6.4          Diabetes: >6.4          Glycemic control for adults with diabetes: <7.0    Est. average glucose Bld gHb Est-mCnc 134 mg/dL  TSH     Status: None   Collection Time: 05/14/24 11:22 AM  Result Value Ref Range   TSH 1.900 0.450 - 4.500 uIU/mL       Assessment & Plan Mixed hyperlipidemia  Gastroesophageal reflux disease with esophagitis without hemorrhage  Arthritis     No follow-ups on file.   Total time spent: {AMA time spent:29001} minutes  ALAN CHRISTELLA ARRANT, FNP  06/18/2024   This document may have been prepared by [facility] Voice Recognition software and as such may include unintentional dictation errors.     [1]  Allergies Allergen Reactions   Penicillin G Rash and Other (See Comments)    Has patient had a PCN reaction causing immediate  rash, facial/tongue/throat swelling, SOB or lightheadedness with hypotension: Yes Has patient had a PCN reaction causing severe rash involving mucus membranes or skin necrosis: no Has patient had a PCN reaction that required hospitalization no Has patient had a PCN reaction occurring within the last 10 years: no If all of the above answers are NO, then may proceed with Cephalosporin use.    Penicillins Rash   "

## 2024-06-20 ENCOUNTER — Other Ambulatory Visit: Payer: Self-pay | Admitting: Family

## 2024-06-20 DIAGNOSIS — M1990 Unspecified osteoarthritis, unspecified site: Secondary | ICD-10-CM

## 2024-06-21 ENCOUNTER — Other Ambulatory Visit: Payer: Self-pay | Admitting: Family

## 2024-06-24 NOTE — Progress Notes (Signed)
 Sandra Watts                                          MRN: [id number]   06/24/2024   The VBCI Quality Team Specialist reviewed this patient medical record for the purposes of chart review for care gap closure. The following were reviewed: abstraction for care gap closure-glycemic status assessment.    VBCI Quality Team

## 2024-08-06 ENCOUNTER — Other Ambulatory Visit
